# Patient Record
Sex: Female | Born: 1970 | Race: Black or African American | Hispanic: No | Marital: Married | Smoking: Never smoker
Health system: Southern US, Community
[De-identification: ages and names within clinical notes are randomized; demographics above are authoritative.]

---

## 2015-10-03 ENCOUNTER — Encounter (HOSPITAL_COMMUNITY): Payer: Self-pay | Admitting: Emergency Medicine

## 2015-10-03 ENCOUNTER — Emergency Department (INDEPENDENT_AMBULATORY_CARE_PROVIDER_SITE_OTHER)
Admission: EM | Admit: 2015-10-03 | Discharge: 2015-10-03 | Disposition: A | Discharge status: Home / Self Care | Payer: Medicaid Other | Source: Home / Self Care | Attending: Emergency Medicine | Admitting: Emergency Medicine

## 2015-10-03 DIAGNOSIS — K089 Disorder of teeth and supporting structures, unspecified: Secondary | ICD-10-CM

## 2015-10-03 DIAGNOSIS — G43809 Other migraine, not intractable, without status migrainosus: Secondary | ICD-10-CM | POA: Diagnosis not present

## 2015-10-03 MED ORDER — KETOROLAC TROMETHAMINE 60 MG/2ML IM SOLN
60.0000 mg | Freq: Once | INTRAMUSCULAR | Status: AC
  Administered 2015-10-03: 60 mg via INTRAMUSCULAR

## 2015-10-03 MED ORDER — METOCLOPRAMIDE HCL 5 MG/ML IJ SOLN
10.0000 mg | Freq: Once | INTRAMUSCULAR | Status: AC
  Administered 2015-10-03: 10 mg via INTRAMUSCULAR

## 2015-10-03 MED ORDER — KETOROLAC TROMETHAMINE 60 MG/2ML IM SOLN
INTRAMUSCULAR | Status: AC
  Filled 2015-10-03: qty 2

## 2015-10-03 MED ORDER — AMOXICILLIN 500 MG PO CAPS: 500.0000 mg | ORAL_CAPSULE | Freq: Two times a day (BID) | ORAL | Status: DC

## 2015-10-03 MED ORDER — METOCLOPRAMIDE HCL 5 MG/ML IJ SOLN
INTRAMUSCULAR | Status: AC
  Filled 2015-10-03: qty 2

## 2015-10-03 MED ORDER — IBUPROFEN 800 MG PO TABS: 800.0000 mg | ORAL_TABLET | Freq: Three times a day (TID) | ORAL | Status: DC | PRN

## 2015-10-03 MED ORDER — DEXAMETHASONE SODIUM PHOSPHATE 10 MG/ML IJ SOLN
10.0000 mg | Freq: Once | INTRAMUSCULAR | Status: AC
  Administered 2015-10-03: 10 mg via INTRAMUSCULAR

## 2015-10-03 MED ORDER — DEXAMETHASONE SODIUM PHOSPHATE 10 MG/ML IJ SOLN
INTRAMUSCULAR | Status: AC
  Filled 2015-10-03: qty 1

## 2015-10-03 NOTE — ED Provider Notes (Signed)
CSN: [id number]     Arrival date & time 10/03/15  1322 History   First MD Initiated Contact with Patient 10/03/15 1523     Chief Complaint  Patient presents with  . Dental Pain  . Headache   (Consider location/radiation/quality/duration/timing/severity/associated sxs/prior Treatment) HPI She is a 44 year old woman here for evaluation of dental pain and headache. Both of these are ongoing problems over years.  She states she has had intermittent pain in her teeth for at least 2 years. It comes and goes. She states 2 or 3 days out of the week she will have pain. She has had several teeth fall out in the last few weeks. She thinks the other ones have cavities. She has never seen a dentist. She arrived to the [place] about 2 months ago.  She also reports intermittent headache. She does currently have a headache. The pain is typically located in the left temple. It is a pulsating ache. It is associated with photophobia. No nausea. No focal numbness, tingling, weakness. She states sometimes it will occur at the same time as her teeth, but not always. She has not tried any medicines.  History reviewed. No pertinent past medical history. History reviewed. No pertinent past surgical history. History reviewed. No pertinent family history. Social History  Substance Use Topics  . Smoking status: Never Smoker   . Smokeless tobacco: None  . Alcohol Use: No   OB History    No data available     Review of Systems As in history of present illness Allergies  Review of patient's allergies indicates no known allergies.  Home Medications   Prior to Admission medications   Medication Sig Start Date End Date Taking? Authorizing Provider  amoxicillin (AMOXIL) 500 MG capsule Take 1 capsule (500 mg total) by mouth 2 (two) times daily. 10/03/15   Charm Rings, MD  ibuprofen (ADVIL,MOTRIN) 800 MG tablet Take 1 tablet (800 mg total) by mouth every 8 (eight) hours as needed for moderate pain. 10/03/15    Charm Rings, MD   Meds Ordered and Administered this Visit   Medications  ketorolac (TORADOL) injection 60 mg (not administered)  metoCLOPramide (REGLAN) injection 10 mg (not administered)  dexamethasone (DECADRON) injection 10 mg (not administered)    BP 162/80 mmHg  Pulse 65  Temp(Src) 98.7 F (37.1 C) (Oral)  Resp 16  SpO2 99%  LMP  (LMP Unknown) No data found.   Physical Exam  Constitutional: She is oriented to person, place, and time. She appears well-developed and well-nourished. No distress.  HENT:  Mouth/Throat: Oropharynx is clear and moist. Abnormal dentition. Dental caries present.  Multiple teeth are broken. Her lower incisor is quite loose. Multiple teeth have erythema at the gumline.  Eyes: Conjunctivae and EOM are normal. Pupils are equal, round, and reactive to light.  Neck: Neck supple.  Cardiovascular: Normal rate, regular rhythm and normal heart sounds.   No murmur heard. Pulmonary/Chest: Effort normal and breath sounds normal. No respiratory distress. She has no wheezes. She has no rales.  Lymphadenopathy:    She has no cervical adenopathy.  Neurological: She is alert and oriented to person, place, and time. No cranial nerve deficit. She exhibits normal muscle tone. Coordination normal.    ED Course  Procedures (including critical care time)  Labs Review Labs Reviewed - No data to display  Imaging Review No results found.    MDM   1. Poor dentition   2. Other type of migraine  Amoxicillin and ibuprofen for her teeth. I have given her contact information for a dentist who accepts Medicaid.  We treated her for an acute migraine today with Toradol, Reglan, and Decadron. Discussed that the ibuprofen will also help with her headache. I suspect that her dental issues are contributing to her headaches.  Follow-up as needed.   Charm Rings, MD 10/03/15 [phone]

## 2015-10-03 NOTE — Discharge Instructions (Signed)
Dr. Lawrence Marseilles is a dentist who excepts your insurance. Please call her as soon as possible to set up an appointment. Take amoxicillin 1 pill twice a day for 10 days. Use ibuprofen 800 mg every 8 hours as needed for pain.  You are having migraine headaches. Some of these are being triggered by your teeth. We gave you some medicine here to help the headache go away. You can use that ibuprofen 800 mg every 8 hours as needed for headache.  Follow-up as needed.

## 2015-10-03 NOTE — ED Notes (Signed)
Pt speaks Swahili, needed an interpreter [phone].  Pt complains of dental pain for two years and headaches an undetermined amount of time that come and go.  She states three of her teeth have fallen out and she thinks she has cavities in the others.  The pain is constantly a 2/10 with it spiking to 8-9/10 and she is unable to do anything.  The headaches are usually a 5/10.

## 2015-11-16 ENCOUNTER — Emergency Department (HOSPITAL_COMMUNITY)
Admission: EM | Admit: 2015-11-16 | Discharge: 2015-11-16 | Disposition: A | Discharge status: Home / Self Care | Payer: Medicaid Other | Attending: Emergency Medicine | Admitting: Emergency Medicine

## 2015-11-16 ENCOUNTER — Encounter (HOSPITAL_COMMUNITY): Payer: Self-pay | Admitting: *Deleted

## 2015-11-16 DIAGNOSIS — H9202 Otalgia, left ear: Secondary | ICD-10-CM | POA: Diagnosis not present

## 2015-11-16 DIAGNOSIS — K0889 Other specified disorders of teeth and supporting structures: Secondary | ICD-10-CM | POA: Insufficient documentation

## 2015-11-16 DIAGNOSIS — K029 Dental caries, unspecified: Secondary | ICD-10-CM | POA: Diagnosis not present

## 2015-11-16 DIAGNOSIS — R51 Headache: Secondary | ICD-10-CM | POA: Insufficient documentation

## 2015-11-16 MED ORDER — PENICILLIN V POTASSIUM 500 MG PO TABS: 500.0000 mg | ORAL_TABLET | Freq: Four times a day (QID) | ORAL | Status: AC

## 2015-11-16 NOTE — Discharge Instructions (Signed)
Schedule a follow up appointment with a dentist from the resource guide. Take the penicillin 4 times a day for 1 week.   Emergency Department Resource Guide 1) Find a Doctor and Pay Out of Pocket Although you won't have to find out who is covered by your insurance plan, it is a good idea to ask around and get recommendations. You will then need to call the office and see if the doctor you have chosen will accept you as a new patient and what types of options they offer for patients who are self-pay. Some doctors offer discounts or will set up payment plans for their patients who do not have insurance, but you will need to ask so you aren't surprised when you get to your appointment.  2) Contact Your Local Health Department Not all health departments have doctors that can see patients for sick visits, but many do, so it is worth a call to see if yours does. If you don't know where your local health department is, you can check in your phone book. The CDC also has a tool to help you locate your state's health department, and many state websites also have listings of all of their local health departments.  3) Find a Walk-in Clinic If your illness is not likely to be very severe or complicated, you may want to try a walk in clinic. These are popping up all over the country in pharmacies, drugstores, and shopping centers. They're usually staffed by nurse practitioners or physician assistants that have been trained to treat common illnesses and complaints. They're usually fairly quick and inexpensive. However, if you have serious medical issues or chronic medical problems, these are probably not your best option.  No Primary Care Doctor: - Call Health Connect at  [phone] - they can help you locate a primary care doctor that  accepts your insurance, provides certain services, etc. - Physician Referral Service- [phone]  Chronic Pain Problems: Organization         Address  Phone   Notes  [facility]  [phone] Patients need to be referred by their primary care doctor.   Medication Assistance: Organization         Address  Phone   Notes  [facility] [address] [place], [place] [address] [phone] --Must be a resident of [facility] -- Must have NO insurance coverage whatsoever (no Medicaid/ Medicare, etc.) -- The pt. MUST have a primary care doctor that directs their care regularly and follows them in the community   MedAssist  [phone]   [employer]  [phone]    Agencies that provide inexpensive medical care: Organization         Address  Phone   Notes  [facility]  [phone]   [facility]    [phone]   [facility] [address] [place], [place] [address] [phone]   [facility] [address], [place] [phone]   [facility]anned Parenthood    [phone]   [facility]    [phone]   [facility]  [address], [place] Phone:  [phone], Fax:  [phone] Hours of Operation:  9 am - 6 pm, M-F.  Also accepts Medicaid/Medicare and self-pay.  [facility]  [address], [place] Phone: [phone](336) [phone],  Fax: [phone]. Hours of Operation:  8:30 am - 5:30 pm, M-F.  Also accepts Medicaid and self-pay.  [facility] [address] [place] Phone: [phone]   [place], [place], [place] [phone], Ext. [phone] Mondays & Thursdays: 7-9 AM.  First 15 patients are seen on a first come, first serve basis.    [place] Providers:  Organization         Address  Phone   Notes  [facility] [address], [place] [phone] Also accepts self-pay patients.  [facility] [id number] [place], [place]  [phone]   [place], [address], [place] [phone]   [facility] [address], [place] [phone]   Lucianne Lei [address], [place]   [phone] Only accepts [place] Access [place] patients after they have their name applied to their card.   Self-Pay (no insurance) in [facility]:  Organization         Address  Phone   Notes  Sickle Cell Patients, [facility] [place] [phone]   [facility] [place] [phone]   [facility] [place]  [place], [place], [place] [phone]   [facility]/Dr. Osei-Bonsu  [address] [place] or [address], [place] [phone] Phone number for both [place] and [place] locations is the same.  Urgent Medical and [facility] [address] [place] [phone]   [facility] [address] [place] or [address] [phone]   [facility] [address], [place] [phone], phone; [phone], fax Sees patients 1st and 3rd Saturday of every month.  Must not qualify for public or private insurance (i.e. Medicaid, Medicare, [place] Health Choice, Veterans' Benefits)  Household income should be no more than 200% of the poverty level The clinic cannot treat you if you are pregnant or think you are pregnant  Sexually transmitted diseases are not treated at the clinic.    Dental Care: Organization         Address  Phone  Notes  [facility] Department of [facility] [place] [phone] Accepts children up to age 15 who are enrolled in [place] or [place]; pregnant women with a Medicaid card; and children who have applied for  Medicaid or [place] Health Choice, but were declined, whose parents can pay a reduced fee at time of service.  [facility] Department of [facility]  [address], [place] [phone] Accepts children up to age 98 who are enrolled in [place] or [place]; pregnant women with a Medicaid card; and children who have applied for Medicaid or [place] Health Choice, but were declined, whose parents can pay a reduced fee at time of service.  [facility]  [place] [phone] Patients are seen by appointment only. Walk-ins are not accepted. [place] will see patients 46 years of age and older. Monday - Tuesday (8am-5pm) Most Wednesdays (8:30-5pm) $30 per visit, cash only  [facility] [employer] PRO[facility]  [address], [employer] (  [phone] Patients are seen by appointment only. Walk-ins are not accepted. [place] will see patients 46 years of age and older. One Wednesday Evening (Monthly: Volunteer Based).  $30 per visit, cash only  [place]  [phone] for adults; Children under age 55, call Graduate Pediatric Dentistry at [phone]. Children aged 45-14, please call [phone] to request a pediatric application.  Dental services are provided in all areas of dental care including fillings, crowns and bridges, complete and partial dentures, implants, gum treatment, root canals, and extractions. Preventive care is also provided. Treatment is provided to both adults and children. Patients are selected via a lottery and there is often a waiting list.   [facility] [address] [place]  [phone] [email or web address]   [facility] [address] [place], [place] [phone], Ext. [phone] Second and Fourth Thursday of each month, opens at 6:30 AM; Clinic ends at 9 AM.  Patients are seen on a first-come first-served basis, and a limited number  are seen during each clinic.   [facility]  [address] Hillard Danker [place], [place] [phone]   Eligibility Requirements You must have lived in [place], [place], or [place] counties for at least the last three months.   You cannot be eligible for state or federal sponsored [employer], including [employer], [place], or [employer].   You generally cannot be eligible for healthcare insurance through your employer.    How to apply: Eligibility screenings are held every Tuesday and Wednesday afternoon from 1:00 pm until 4:00 pm. You do not need an appointment for the interview!  [facility] [address], [place], [place]   [place]  [place] Department  [place]  [phone]    Behavioral Health Resources in the Community: Intensive Outpatient Programs Organization         Address  Phone  Notes  [place]. [address], [place], [place] [phone]   [facility] Outpatient [address] [place], [place]   ADS: Alcohol & Drug Svcs [address], [place], [place]   [place] [address],  [place], [place] or [phone]   Substance Abuse Resources Organization         Address  Phone  Notes  Alcohol and Drug Services  [phone]   [place]  [phone]   [facility] [place]   Chinita Pester  [phone]   Residential & Outpatient Substance Abuse Program  [phone]   Psychological Services Organization         Address  Phone  Notes  [facility] [place]  [place]  [phone]   [place] [address] [place] or [phone]    Mobile Crisis Teams Organization         Address  Phone  Notes  Therapeutic  Alternatives, Mobile Crisis Care Unit  [phone]   Assertive Psychotherapeutic Services  [address] [place], [place]   [place] Levels [address], [place] [phone]    Self-Help/Support Groups Organization         Address  Phone             Notes  [place] of [place] - variety of support groups  336- H3156881 Call for more information  Narcotics Anonymous (NA), Caring Services [address], [employer] [place]  2 meetings at this location   Residential Facilities manager         Address  Phone  Notes  [facility] [place],    [place]  [phone]-(684)[phone]   [facility]  [address], [place] [phone], [place], [place]   [place], [place] [phone] Admissions: 8am-3pm M-F  Incentives Substance [place] [address]    [place], [place] [phone]   [facility] [address] [place], [place], [place]   [facility] [address],  [place], [place]   [facility] Programs - Intensive Outpatient [address] Kristeen [address], [place], [place]   [facility] [place].,  [place], [place] [phone] or [phone]   [facility]idential Treatment Services (RTS) [address] [place], [place] Accepts Medicaid  [facility] [place] [address],  [place] [phone] Substance Abuse/Addiction Treatment   [facility] Organization         Address  Phone  Notes  [facility]  [phone]   Domenic Schwab, PhD [address] Arlis Porta [place], [place]   [phone] or [phone]   [place], [place] [phone]   [facility] [address] [place], [place] [phone] Insurance/Medicaid/sponsorship through [facility]  [address] [place]                                    [place], [place] [phone] [place] [address], [place] [phone]    Dr. Adele Schilder  [phone]   [facility] Dept. 1) [address] [place] 2) [place] 3)  [address], [place] [phone]  [phone]   [place] [phone] or [phone] (After Hours)

## 2015-11-16 NOTE — ED Provider Notes (Signed)
CSN: [id number]     Arrival date & time 11/16/15  8295 History  By signing my name below, I, Emmanuella Mensah, attest that this documentation has been prepared under the direction and in the presence of Kyri Dai, PA-C. Electronically Signed: Angelene Giovanni, ED Scribe. 11/16/2015. 10:58 AM.      Chief Complaint  Patient presents with  . Dental Pain  . Otalgia   The history is provided by the patient. A language interpreter was used.   HPI Comments: Madison Hunt is a 44 y.o. female who presents to the Emergency Department complaining of gradually worsening constant upper left dental pain that radiates to her left ear onset 3 days ago. Pt reports associated intermittent generalized HA when the pain becomes more severe. She explains that the pain is worse with cold foods and cold air. She denies any fever, chills, trouble swallowing, difficulty handling secretions, SOB, drainage from the ear or loss of hearing. She states that she took Tylenol for her symptoms PTA with moderate relief. States that she has a Education officer, community but has not visited him for this problem yet.  History reviewed. No pertinent past medical history. History reviewed. No pertinent past surgical history. History reviewed. No pertinent family history. Social History  Substance Use Topics  . Smoking status: Unknown If Ever Smoked  . Smokeless tobacco: None  . Alcohol Use: None   OB History    No data available     Review of Systems  Constitutional: Negative for fever and chills.  HENT: Positive for dental problem and ear pain. Negative for drooling, ear discharge, hearing loss and trouble swallowing.   Respiratory: Negative for shortness of breath.   Gastrointestinal: Negative for nausea and vomiting.  Musculoskeletal: Negative for neck pain.  Skin: Negative for color change.  Neurological: Positive for headaches.  All other systems reviewed and are negative.     Allergies  Review of patient's allergies  indicates no known allergies.  Home Medications   Prior to Admission medications   Medication Sig Start Date End Date Taking? Authorizing Provider  acetaminophen (TYLENOL) 325 MG tablet Take 650 mg by mouth every 6 (six) hours as needed.   Yes Historical Provider, MD  penicillin v potassium (VEETID) 500 MG tablet Take 1 tablet (500 mg total) by mouth 4 (four) times daily. 11/16/15 11/23/15  Kiarah Eckstein, PA-C   BP 146/95 mmHg  Pulse 63  Temp(Src) 97.8 F (36.6 C) (Oral)  Resp 20  Wt 115 lb (52.164 kg)  SpO2 98% Physical Exam  Constitutional: She appears well-developed and well-nourished. No distress.  HENT:  Head: Normocephalic and atraumatic.  Right Ear: External ear normal.  Left Ear: External ear normal.  Mouth/Throat: Oropharynx is clear and moist and mucous membranes are normal. No trismus in the jaw. Dental caries present. No dental abscesses or uvula swelling. No oropharyngeal exudate.    Poor dentition throughout. Missing second molar in left upper jaw. 3rd molar with severe decay and TTP.  No obvious abscess. No swelling or erythema of the cheeks.   Eyes: Conjunctivae are normal. Right eye exhibits no discharge. Left eye exhibits no discharge. No scleral icterus.  Neck: Normal range of motion. Neck supple.  No cervical adenopathy. FROM of neck intact. No swelling of the soft tissue.  Cardiovascular: Normal rate.   Pulmonary/Chest: Effort normal and breath sounds normal. No respiratory distress.  Musculoskeletal: Normal range of motion.  Moves all extremities spontaneously  Lymphadenopathy:    She has no cervical adenopathy.  Neurological: She is alert. Coordination normal.  Skin: Skin is warm and dry.  Psychiatric: She has a normal mood and affect. Her behavior is normal.  Nursing note and vitals reviewed.   ED Course  Procedures (including critical care time) DIAGNOSTIC STUDIES: Oxygen Saturation is 98% on RA, normal by my interpretation.    COORDINATION OF  CARE: 10:48 AM- Pt advised of plan for treatment and pt agrees. Explained that pt has a cavity on upper tooth. Will receive Penicillin and advised to follow up with dentist and to continue the OTC pain medications. Will provide a list of local dentist for establishment of care.    Labs Review Labs Reviewed - No data to display  Imaging Review No results found.   Rolm GalaStevi Traevion Poehler, PA-C has personally reviewed and evaluated these images and lab results as part of her medical decision-making.   EKG Interpretation None      MDM   Final diagnoses:  Pain, dental   Patient presenting with tooth pain. No obvious abscess on exam. No soft tissue swelling of the cheek or neck. Exam unconcerning for Ludwig's angina or spread of infection. Pt had taken tylenol PTA with good pain control. Will discharge with penicillin and encouraged pt to follow-up with dentist. Resource guide given in discharge paperwork. Return precautions discussed with pt and given in discharge paperwork. Stable for discharge.   I personally performed the services described in this documentation, which was scribed in my presence. The recorded information has been reviewed and is accurate.   Alveta HeimlichStevi Seve Monette, PA-C 11/16/15 1137  Raeford RazorStephen Kohut, MD 11/17/15 [phone]

## 2015-11-16 NOTE — ED Notes (Signed)
Pt in c/o toothache and bilateral earache for the last few days, denies fever

## 2015-11-16 NOTE — ED Notes (Signed)
Declined W/C at D/C and was escorted to lobby by RN. 

## 2015-11-16 NOTE — ED Notes (Signed)
Pt tel. Number is [phone] husbands number.  Medicaid card has [place] and wellness listed as the provider.

## 2015-11-16 NOTE — ED Notes (Signed)
Secondary assessment being completed by PA at bedside

## 2015-11-17 ENCOUNTER — Encounter (HOSPITAL_COMMUNITY): Payer: Self-pay | Admitting: Emergency Medicine

## 2016-01-26 ENCOUNTER — Ambulatory Visit: Payer: Medicaid Other | Attending: Internal Medicine | Admitting: Internal Medicine

## 2016-01-26 ENCOUNTER — Encounter: Payer: Self-pay | Admitting: Internal Medicine

## 2016-01-26 VITALS — BP 148/86 | HR 56 | Temp 98.2°F | Resp 17 | Ht 64.0 in | Wt 124.4 lb

## 2016-01-26 DIAGNOSIS — B9789 Other viral agents as the cause of diseases classified elsewhere: Secondary | ICD-10-CM

## 2016-01-26 DIAGNOSIS — J069 Acute upper respiratory infection, unspecified: Secondary | ICD-10-CM | POA: Diagnosis not present

## 2016-01-26 DIAGNOSIS — J029 Acute pharyngitis, unspecified: Secondary | ICD-10-CM | POA: Insufficient documentation

## 2016-01-26 DIAGNOSIS — M25511 Pain in right shoulder: Secondary | ICD-10-CM | POA: Diagnosis not present

## 2016-01-26 DIAGNOSIS — Z79899 Other long term (current) drug therapy: Secondary | ICD-10-CM | POA: Diagnosis not present

## 2016-01-26 DIAGNOSIS — K047 Periapical abscess without sinus: Secondary | ICD-10-CM

## 2016-01-26 MED ORDER — NAPROXEN 500 MG PO TABS
500.0000 mg | ORAL_TABLET | Freq: Two times a day (BID) | ORAL | Status: DC
Start: 2016-01-26 — End: 2023-09-23

## 2016-01-26 MED ORDER — FLUTICASONE PROPIONATE 50 MCG/ACT NA SUSP: 2.0000 | Freq: Every day | NASAL | Status: DC

## 2016-01-26 MED ORDER — AMOXICILLIN 500 MG PO CAPS: 500.0000 mg | ORAL_CAPSULE | Freq: Three times a day (TID) | ORAL | Status: DC

## 2016-01-26 MED FILL — AMOXICILLIN 500 MG CAPSULE: 500 | 10 days supply | Qty: 30 | Fill #0

## 2016-01-26 MED FILL — FLUTICASONE PROP 50 MCG SPR: 50 | 30 days supply | Qty: 16 | Fill #0

## 2016-01-26 MED FILL — NAPROXEN 500 MG TABLET: 500 | 15 days supply | Qty: 30 | Fill #0

## 2016-01-26 NOTE — Progress Notes (Signed)
Patient ID: Madison Hunt, female   DOB: 1971/08/28, 45 y.o.   MRN: [id number]   [id number]  [id number]  DOB - May 23, 1971  CC:  Chief Complaint  Patient presents with  . New Patient (Initial Visit)       HPI: Madison Hunt is a 45 y.o. female here today to establish medical care. Patient has no past medical history and is not on any medications. Been having body aches, cough, clear mucous production, sore throat, rhinitis, chills for the past week. She is concerned about dental pain in the lower back of her mouth. She was told by a dentist 3 months ago that the tooth would need removal soon. Some gum swelling.  Right shoulder pain that is constant and achy. Aggravated more by movement. No injury. She has not tried anything for pain.   No Known Allergies History reviewed. No pertinent past medical history. Current Outpatient Prescriptions on File Prior to Visit  Medication Sig Dispense Refill  . acetaminophen (TYLENOL) 325 MG tablet Take 650 mg by mouth every 6 (six) hours as needed. Reported on 01/26/2016    . amoxicillin (AMOXIL) 500 MG capsule Take 1 capsule (500 mg total) by mouth 2 (two) times daily. (Patient not taking: Reported on 01/26/2016) 20 capsule 0  . ibuprofen (ADVIL,MOTRIN) 800 MG tablet Take 1 tablet (800 mg total) by mouth every 8 (eight) hours as needed for moderate pain. (Patient not taking: Reported on 01/26/2016) 30 tablet 0   No current facility-administered medications on file prior to visit.   History reviewed. No pertinent family history. Social History   Social History  . Marital Status: Married    Spouse Name: N/A  . Number of Children: N/A  . Years of Education: N/A   Occupational History  . Not on file.   Social History Main Topics  . Smoking status: Unknown If Ever Smoked  . Smokeless tobacco: Not on file  . Alcohol Use: No  . Drug Use: No  . Sexual Activity: Not on file   Other Topics Concern  . Not on file   Social History Narrative   ** Merged History Encounter **        Review of Systems: Other than what is stated in HPI, all other systems are negative.    Objective:   Filed Vitals:   01/26/16 0938  BP: 148/86  Pulse: 56  Temp: 98.2 F (36.8 C)  Resp: 17    Physical Exam  Constitutional: She is oriented to person, place, and time.  HENT:  Right Ear: External ear normal.  Left Ear: External ear normal.  Mouth/Throat: Oropharynx is clear and moist. Dental caries (gum swelling) present.  Eyes: Right eye exhibits no discharge. Left eye exhibits no discharge.  Cardiovascular: Normal rate, regular rhythm and normal heart sounds.   Pulmonary/Chest: Effort normal and breath sounds normal.  Musculoskeletal: Normal range of motion. She exhibits no tenderness.  Lymphadenopathy:    She has cervical adenopathy.  Neurological: She is alert and oriented to person, place, and time.     No results found for: WBC, HGB, HCT, MCV, PLT No results found for: CREATININE, BUN, NA, K, CL, CO2  No results found for: HGBA1C Lipid Panel  No results found for: CHOL, TRIG, HDL, CHOLHDL, VLDL, LDLCALC     Assessment and plan:   Madison Hunt was seen today for new patient (initial visit).  Diagnoses and all orders for this visit:  Dental infection -     Begin amoxicillin (AMOXIL) 500  MG capsule; Take 1 capsule (500 mg total) by mouth 3 (three) times daily. Dental infection Patient will call to make a repeat dentist appointment after completion of antibiotics  Right shoulder pain -     Begin naproxen (NAPROSYN) 500 MG tablet; Take 1 tablet (500 mg total) by mouth 2 (two) times daily with a meal. For pain  Viral URI with cough -     fluticasone (FLONASE) 50 MCG/ACT nasal spray; Place 2 sprays into both nostrils daily.  Due to language barrier, an interpreter was present during the history-taking and subsequent discussion (and for part of the physical exam) with this patient.  Return if symptoms worsen or fail to  improve.   Madison Finland, NP-C [facility] [phone] 01/26/2016, 10:01 AM

## 2016-01-26 NOTE — Progress Notes (Signed)
swahilli interpreter used ID# [id number] Patient here to establish care Complains of cough and chills for the past couple of days Currently not taking any prescribed medications

## 2016-11-22 ENCOUNTER — Encounter (HOSPITAL_COMMUNITY): Payer: Self-pay

## 2016-11-22 ENCOUNTER — Emergency Department (HOSPITAL_COMMUNITY)
Admission: EM | Admit: 2016-11-22 | Discharge: 2016-11-23 | Disposition: A | Discharge status: Home / Self Care | Payer: BLUE CROSS/BLUE SHIELD | Attending: Emergency Medicine | Admitting: Emergency Medicine

## 2016-11-22 ENCOUNTER — Emergency Department (HOSPITAL_COMMUNITY): Payer: BLUE CROSS/BLUE SHIELD

## 2016-11-22 DIAGNOSIS — M25511 Pain in right shoulder: Secondary | ICD-10-CM | POA: Diagnosis not present

## 2016-11-22 DIAGNOSIS — R0981 Nasal congestion: Secondary | ICD-10-CM | POA: Diagnosis present

## 2016-11-22 DIAGNOSIS — M7918 Myalgia, other site: Secondary | ICD-10-CM

## 2016-11-22 DIAGNOSIS — J069 Acute upper respiratory infection, unspecified: Secondary | ICD-10-CM | POA: Diagnosis not present

## 2016-11-22 NOTE — ED Notes (Signed)
Patient transported to X-ray 

## 2016-11-22 NOTE — ED Provider Notes (Signed)
[facility]-EMERGENCY DEPT Provider Note   CSN: [id number] Arrival date & time: 11/22/16  1623  History   Chief Complaint Chief Complaint  Patient presents with  . URI    HPI Madison Hunt is a 45 y.o. female.  HPI   HPI done with Swahili interpretor, there was still some difficulty communicating due to patients dialect not being available.   Patient has been having right shoulder/arm pain for 1 month. 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 She comes to the ER today because she is having nasal congestion and a headache. She has not had fevers, CP, SOB, weakness, fevers, dysuria, jaundice, confusion. Her headache is frontal and right parietal. She denies having any neck pain, back pain, abdominal pain.  No past medical history on file.  There are no active problems to display for this patient.   No past surgical history on file.  OB History    Gravida Para Term Preterm AB Living   0 0 0 0 0     SAB TAB Ectopic Multiple Live Births   0 0 0           Home Medications    Prior to Admission medications   Medication Sig Start Date End Date Taking? Authorizing Provider  acetaminophen (TYLENOL) 325 MG tablet Take 650 mg by mouth every 6 (six) hours as needed. Reported on 01/26/2016    Historical Provider, MD  amoxicillin (AMOXIL) 500 MG capsule Take 1 capsule (500 mg total) by mouth 2 (two) times daily. Patient not taking: Reported on 01/26/2016 10/03/15   Charm Rings, MD  amoxicillin (AMOXIL) 500 MG capsule Take 1 capsule (500 mg total) by mouth 3 (three) times daily. Dental infection 01/26/16   Ambrose Finland, NP  azithromycin (ZITHROMAX) 250 MG tablet Take 1 tablet (250 mg total) by mouth daily. Take 1 tab every day until finished. 11/24/16   Maika Mcelveen Neva Seat, PA-C  fluticasone (FLONASE) 50 MCG/ACT nasal spray Place 2 sprays into both nostrils daily. 01/26/16   Ambrose Finland, NP  guaiFENesin (ROBITUSSIN) 100 MG/5ML liquid Take 5-10 mLs (100-200 mg  total) by mouth every 4 (four) hours as needed for cough. 11/23/16   Thea Holshouser Neva Seat, PA-C  ibuprofen (ADVIL,MOTRIN) 600 MG tablet Take 1 tablet (600 mg total) by mouth every 6 (six) hours as needed. 11/23/16   Adalida Garver Neva Seat, PA-C  ibuprofen (ADVIL,MOTRIN) 800 MG tablet Take 1 tablet (800 mg total) by mouth every 8 (eight) hours as needed for moderate pain. Patient not taking: Reported on 01/26/2016 10/03/15   Charm Rings, MD  naproxen (NAPROSYN) 500 MG tablet Take 1 tablet (500 mg total) by mouth 2 (two) times daily with a meal. For pain 01/26/16   Ambrose Finland, NP    Family History No family history on file.  Social History Social History  Substance Use Topics  . Smoking status: Unknown If Ever Smoked  . Smokeless tobacco: Never Used  . Alcohol use No     Allergies   Patient has no known allergies.   Review of Systems Review of Systems Review of Systems All other systems negative except as documented in the HPI. All pertinent positives and negatives as reviewed in the HPI.   Physical Exam Updated Vital Signs BP 153/69   Pulse 61   Temp 98.1 F (36.7 C) (Oral)   Resp 18   LMP  (LMP Unknown)   SpO2 100%   Physical Exam  Constitutional: She appears well-developed  and well-nourished.  HENT:  Head: Normocephalic and atraumatic.  Right Ear: Tympanic membrane and ear canal normal.  Left Ear: Tympanic membrane and ear canal normal.  Nose: Right sinus exhibits maxillary sinus tenderness and frontal sinus tenderness.  Eyes: Conjunctivae are normal. Pupils are equal, round, and reactive to light.  Neck: Trachea normal, normal range of motion and full passive range of motion without pain. Neck supple.  Cardiovascular: Normal rate, regular rhythm and normal pulses.   Pulmonary/Chest: Effort normal and breath sounds normal. Chest wall is not dull to percussion. She exhibits no tenderness, no crepitus, no edema, no deformity and no retraction.  Abdominal: Soft. Normal appearance  and bowel sounds are normal.  Musculoskeletal: Normal range of motion.       Right shoulder: She exhibits tenderness and pain. She exhibits normal range of motion, no bony tenderness, no swelling, no effusion, no crepitus, no deformity, no laceration, normal pulse and normal strength.  No LE swelling  Lymphadenopathy:       Head (right side): No submental, no submandibular, no tonsillar, no preauricular, no posterior auricular and no occipital adenopathy present.       Head (left side): No submental, no submandibular, no tonsillar, no preauricular, no posterior auricular and no occipital adenopathy present.    She has no cervical adenopathy.    She has no axillary adenopathy.  Neurological: She is alert. She has normal strength.  Skin: Skin is warm, dry and intact.  Psychiatric: She has a normal mood and affect. Her speech is normal and behavior is normal. Judgment and thought content normal. Cognition and memory are normal.     ED Treatments / Results  Labs (all labs ordered are listed, but only abnormal results are displayed) Labs Reviewed - No data to display  EKG  EKG Interpretation None       Radiology Dg Chest 2 View  Result Date: 11/22/2016 CLINICAL DATA:  Upper respiratory infection symptoms for 1 month. EXAM: CHEST  2 VIEW COMPARISON:  None. FINDINGS: The heart size and mediastinal contours are within normal limits. Both lungs are clear. The visualized skeletal structures are unremarkable. IMPRESSION: No active cardiopulmonary disease. Electronically Signed   By: Burman NievesWilliam  Stevens M.D.   On: 11/22/2016 23:32    Procedures Procedures (including critical care time)  Medications Ordered in ED Medications  azithromycin (ZITHROMAX) tablet 500 mg (500 mg Oral Given 11/23/16 0117)  ibuprofen (ADVIL,MOTRIN) tablet 800 mg (800 mg Oral Given 11/23/16 0118)  guaiFENesin (ROBITUSSIN) 100 MG/5ML solution 100 mg (100 mg Oral Given 11/23/16 0117)     Initial Impression /  Assessment and Plan / ED Course  I have reviewed the triage vital signs and the nursing notes.  Pertinent labs & imaging results that were available during my care of the patient were reviewed by me and considered in my medical decision making (see chart for details).  Clinical Course     Pt is well appearing. Does not have any systemic symptoms or concern for malaria at this time. She has sinus pressure and MSK pain. Pt symptoms consistent with URI. CXR negative for acute infiltrate. Pt will be discharged with symptomatic treatment as well as Azithromycin.  Discussed return precautions.  Pt is hemodynamically stable & in NAD prior to discharge.   Final Clinical Impressions(s) / ED Diagnoses   Final diagnoses:  Musculoskeletal pain  Upper respiratory tract infection, unspecified type    New Prescriptions New Prescriptions   AZITHROMYCIN (ZITHROMAX) 250 MG TABLET  Take 1 tablet (250 mg total) by mouth daily. Take 1 tab every day until finished.   GUAIFENESIN (ROBITUSSIN) 100 MG/5ML LIQUID    Take 5-10 mLs (100-200 mg total) by mouth every 4 (four) hours as needed for cough.   IBUPROFEN (ADVIL,MOTRIN) 600 MG TABLET    Take 1 tablet (600 mg total) by mouth every 6 (six) hours as needed.     Marlon Peliffany Nabria Nevin, PA-C 11/23/16 0202    Gilda Creasehristopher J Pollina, MD 11/23/16 [phone]

## 2016-11-22 NOTE — ED Triage Notes (Addendum)
Triage completed using the pacific interpreter line. Pt unable to answer many of the questions even with using the interpreter line. Pt states she has a headache and a cold. Pt states she has been feeling bad X1 month.

## 2016-11-23 MED ORDER — AZITHROMYCIN 250 MG PO TABS: 250.0000 mg | ORAL_TABLET | Freq: Every day | ORAL | 0 refills | Status: DC

## 2016-11-23 MED ORDER — IBUPROFEN 600 MG PO TABS: 600.0000 mg | ORAL_TABLET | Freq: Four times a day (QID) | ORAL | 0 refills | Status: DC | PRN

## 2016-11-23 MED ORDER — IBUPROFEN 800 MG PO TABS
800.0000 mg | ORAL_TABLET | Freq: Once | ORAL | Status: AC
  Administered 2016-11-23: 800 mg via ORAL
  Filled 2016-11-23: qty 1

## 2016-11-23 MED ORDER — GUAIFENESIN 100 MG/5ML PO LIQD: 100.0000 mg | ORAL | 0 refills | Status: DC | PRN

## 2016-11-23 MED ORDER — GUAIFENESIN 100 MG/5ML PO SOLN
5.0000 mL | Freq: Once | ORAL | Status: AC
  Administered 2016-11-23: 100 mg via ORAL
  Filled 2016-11-23: qty 5

## 2016-11-23 MED ORDER — AZITHROMYCIN 250 MG PO TABS
500.0000 mg | ORAL_TABLET | Freq: Once | ORAL | Status: AC
  Administered 2016-11-23: 500 mg via ORAL
  Filled 2016-11-23: qty 2

## 2017-02-16 ENCOUNTER — Emergency Department (HOSPITAL_COMMUNITY): Payer: BLUE CROSS/BLUE SHIELD

## 2017-02-16 ENCOUNTER — Encounter (HOSPITAL_COMMUNITY): Payer: Self-pay | Admitting: Emergency Medicine

## 2017-02-16 ENCOUNTER — Emergency Department (HOSPITAL_COMMUNITY)
Admission: EM | Admit: 2017-02-16 | Discharge: 2017-02-16 | Disposition: A | Discharge status: Home / Self Care | Payer: BLUE CROSS/BLUE SHIELD | Attending: Emergency Medicine | Admitting: Emergency Medicine

## 2017-02-16 ENCOUNTER — Ambulatory Visit (HOSPITAL_COMMUNITY)
Admission: EM | Admit: 2017-02-16 | Discharge: 2017-02-16 | Disposition: A | Discharge status: Home / Self Care | Payer: BLUE CROSS/BLUE SHIELD

## 2017-02-16 DIAGNOSIS — K146 Glossodynia: Secondary | ICD-10-CM | POA: Insufficient documentation

## 2017-02-16 DIAGNOSIS — M25562 Pain in left knee: Secondary | ICD-10-CM | POA: Diagnosis not present

## 2017-02-16 DIAGNOSIS — R002 Palpitations: Secondary | ICD-10-CM | POA: Insufficient documentation

## 2017-02-16 DIAGNOSIS — R000 Tachycardia, unspecified: Secondary | ICD-10-CM | POA: Diagnosis present

## 2017-02-16 LAB — CBC
HEMATOCRIT: 38.3 % (ref 36.0–46.0)
HEMOGLOBIN: 12.9 g/dL (ref 12.0–15.0)
MCH: 29 pg (ref 26.0–34.0)
MCHC: 33.7 g/dL (ref 30.0–36.0)
MCV: 86.1 fL (ref 78.0–100.0)
Platelets: 218 10*3/uL (ref 150–400)
RBC: 4.45 MIL/uL (ref 3.87–5.11)
RDW: 13.9 % (ref 11.5–15.5)
WBC: 5.8 10*3/uL (ref 4.0–10.5)

## 2017-02-16 LAB — BASIC METABOLIC PANEL
ANION GAP: 6 (ref 5–15)
BUN: 7 mg/dL (ref 6–20)
CHLORIDE: 106 mmol/L (ref 101–111)
CO2: 27 mmol/L (ref 22–32)
Calcium: 9 mg/dL (ref 8.9–10.3)
Creatinine, Ser: 0.5 mg/dL (ref 0.44–1.00)
GFR calc Af Amer: >60 mL/min (ref >60)
GLUCOSE: 87 mg/dL (ref 65–99)
POTASSIUM: 3.5 mmol/L (ref 3.5–5.1)
Sodium: 139 mmol/L (ref 135–145)

## 2017-02-16 LAB — I-STAT TROPONIN, ED: Troponin i, poc: 0 ng/mL (ref 0.00–0.08)

## 2017-02-16 MED ORDER — IBUPROFEN 800 MG PO TABS: 800.0000 mg | ORAL_TABLET | Freq: Three times a day (TID) | ORAL | 0 refills | Status: DC | PRN

## 2017-02-16 NOTE — ED Triage Notes (Signed)
Translator stated, I have heart racing and my tongue doing all kinds of twitching and moving a lot. This started Thursday.

## 2017-02-16 NOTE — ED Provider Notes (Signed)
Emergency Department Provider Note   I have reviewed the triage vital signs and the nursing notes.  Encounter with [employer] translator.   HISTORY  Chief Complaint Foot Pain; Tachycardia; and Leg Pain   HPI Madison Hunt is a 46 y.o. female with no significant past medical history presents to the emergency department for evaluation of multiple medical complaints including intermittent heart palpitations, abnormal tongue movements, and left leg pain with squatting. She she's had intermittent heart palpitations over the past 3 days. Her tongue movements have been intermittent for several months and her left leg pain is a soreness that is worse with squatting down. No modifying factors for any of the above complaints. She is not taken any over-the-counter medications. She states that her primary concern is for her heart but she would also like the other complaints addressed as well. No history of heart attack or stroke. Patient denies taking any new medications or herbal supplements.   History reviewed. No pertinent past medical history.  There are no active problems to display for this patient.   History reviewed. No pertinent surgical history.  Current Outpatient Rx  . Order #: [id number] Class: Historical Med  . Order #: [id number] Class: Normal  . Order #: [id number] Class: Print  . Order #: [id number] Class: Print  . Order #: [id number] Class: Normal    Allergies Patient has no known allergies.  No family history on file.  Social History Social History  Substance Use Topics  . Smoking status: Never Smoker  . Smokeless tobacco: Never Used  . Alcohol use No    Review of Systems  Constitutional: No fever/chills Eyes: No visual changes. ENT: No sore throat. Intermittent abnormal tongue movements.  Cardiovascular: Denies chest pain. Positive palpitations.  Respiratory: Denies shortness of breath. Gastrointestinal: No abdominal pain.  No nausea, no vomiting.  No diarrhea.   No constipation. Genitourinary: Negative for dysuria. Musculoskeletal: Negative for back pain. Left knee pain with squatting.  Skin: Negative for rash. Neurological: Negative for headaches, focal weakness or numbness.  10-point ROS otherwise negative.  ____________________________________________   PHYSICAL EXAM:  VITAL SIGNS: ED Triage Vitals  Enc Vitals Group     BP 02/16/17 1338 (!) 155/112     Pulse Rate 02/16/17 1338 99     Resp 02/16/17 1338 18     Temp 02/16/17 1338 98.5 F (36.9 C)     Temp Source 02/16/17 1338 Oral     SpO2 02/16/17 1338 99 %     Pain Score 02/16/17 1322 5   Constitutional: Alert and oriented. Well appearing and in no acute distress. Eyes: Conjunctivae are normal. Head: Atraumatic. Nose: No congestion/rhinnorhea. Mouth/Throat: Mucous membranes are moist.  Oropharynx non-erythematous. Neck: No stridor.   Cardiovascular: Sinus rhythm. Good peripheral circulation. Grossly normal heart sounds.   Respiratory: Normal respiratory effort.  No retractions. Lungs CTAB. Gastrointestinal: Soft and nontender. No distention.  Musculoskeletal: No lower extremity tenderness nor edema. No gross deformities of extremities. No knee effusion. No overlying erythema or warmth. Normal ROM. Weight bearing without difficulty.  Neurologic:  Normal speech and language. No gross focal neurologic deficits are appreciated.  Skin:  Skin is warm, dry and intact. No rash noted. Psychiatric: Mood and affect are normal. Speech and behavior are normal.  ____________________________________________   LABS (all labs ordered are listed, but only abnormal results are displayed)  Labs Reviewed  BASIC METABOLIC PANEL  CBC  I-STAT TROPOININ, ED   ____________________________________________  EKG   EKG Interpretation  Date/Time:  Sunday February 16 2017 13:13:31 EST Ventricular Rate:  57 PR Interval:  194 QRS Duration: 84 QT Interval:  454 QTC Calculation: 441 R  Axis:   54 Text Interpretation:  Sinus bradycardia Nonspecific T wave abnormality Abnormal ECG No STEMI.  Confirmed by Jobani Sabado MD, Ambreen Tufte [phone]) on 02/16/2017 2:00:21 PM       ____________________________________________  RADIOLOGY  CXR and left Knee x-rays reviewed. No acute findings.  ____________________________________________   PROCEDURES  Procedure(s) performed:   Procedures  None ____________________________________________   INITIAL IMPRESSION / ASSESSMENT AND PLAN / ED COURSE  Pertinent labs & imaging results that were available during my care of the patient were reviewed by me and considered in my medical decision making (see chart for details).  Patient resents to the emergency department for evaluation of intermittent heart palpitations, knee pain, abnormal tongue movements. The patient is not on any medications at home that I expect to cause dystonic reactions. No abnormal tongue movements identified on my oral exam. Patient is weightbearing on her legs without difficulty. She has no appreciable effusion. No concern for septic arthritis. Plan for plain film of the knee. We'll obtain chest x-ray, labs, troponin for heart palpitations.  Normal labs, CXR, and knee x-ray. Will prescribe short course of NSAID and refer to PCP for further mgmt. [facility] in the past.  ____________________________________________  FINAL CLINICAL IMPRESSION(S) / ED DIAGNOSES  Final diagnoses:  Acute pain of left knee  Palpitations  Tongue pain     MEDICATIONS GIVEN DURING THIS VISIT:  None  NEW OUTPATIENT MEDICATIONS STARTED DURING THIS VISIT:  Motrin 800 mg   Note:  This document was prepared using Dragon voice recognition software and may include unintentional dictation errors.  Alona Bene, MD Emergency Medicine   Maia Plan, MD 02/18/17 1051

## 2017-02-16 NOTE — Discharge Instructions (Signed)

## 2017-02-16 NOTE — ED Triage Notes (Signed)
Translator stated, her legs and feet hurts for 2 months.

## 2017-10-15 IMAGING — CR DG CHEST 2V
2 series · 2 of 2 positions shown · non-contrast
Comparison: None.

CLINICAL DATA: Upper respiratory infection symptoms for 1 month.

EXAM:
CHEST  2 VIEW

[chest pa]
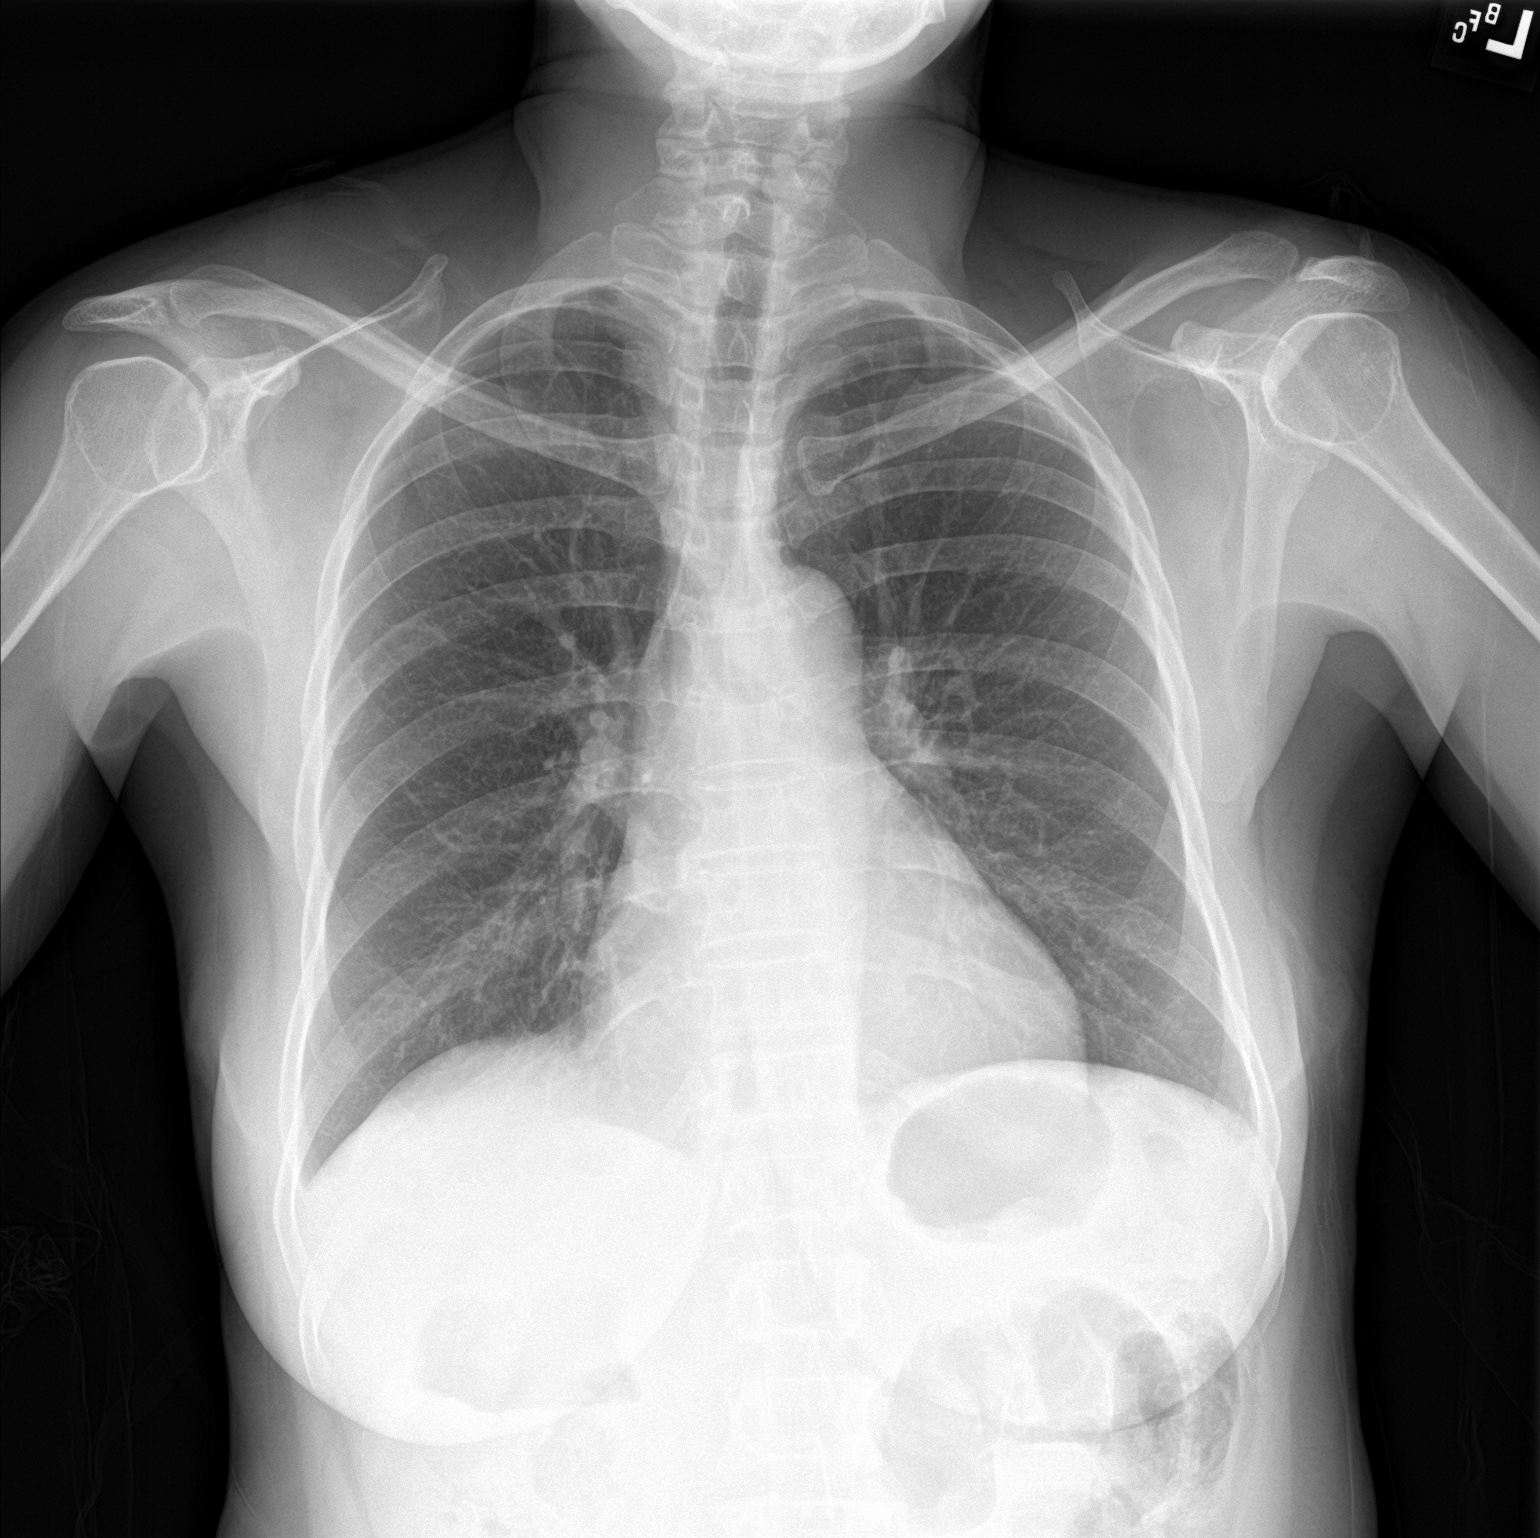

[chest lat]
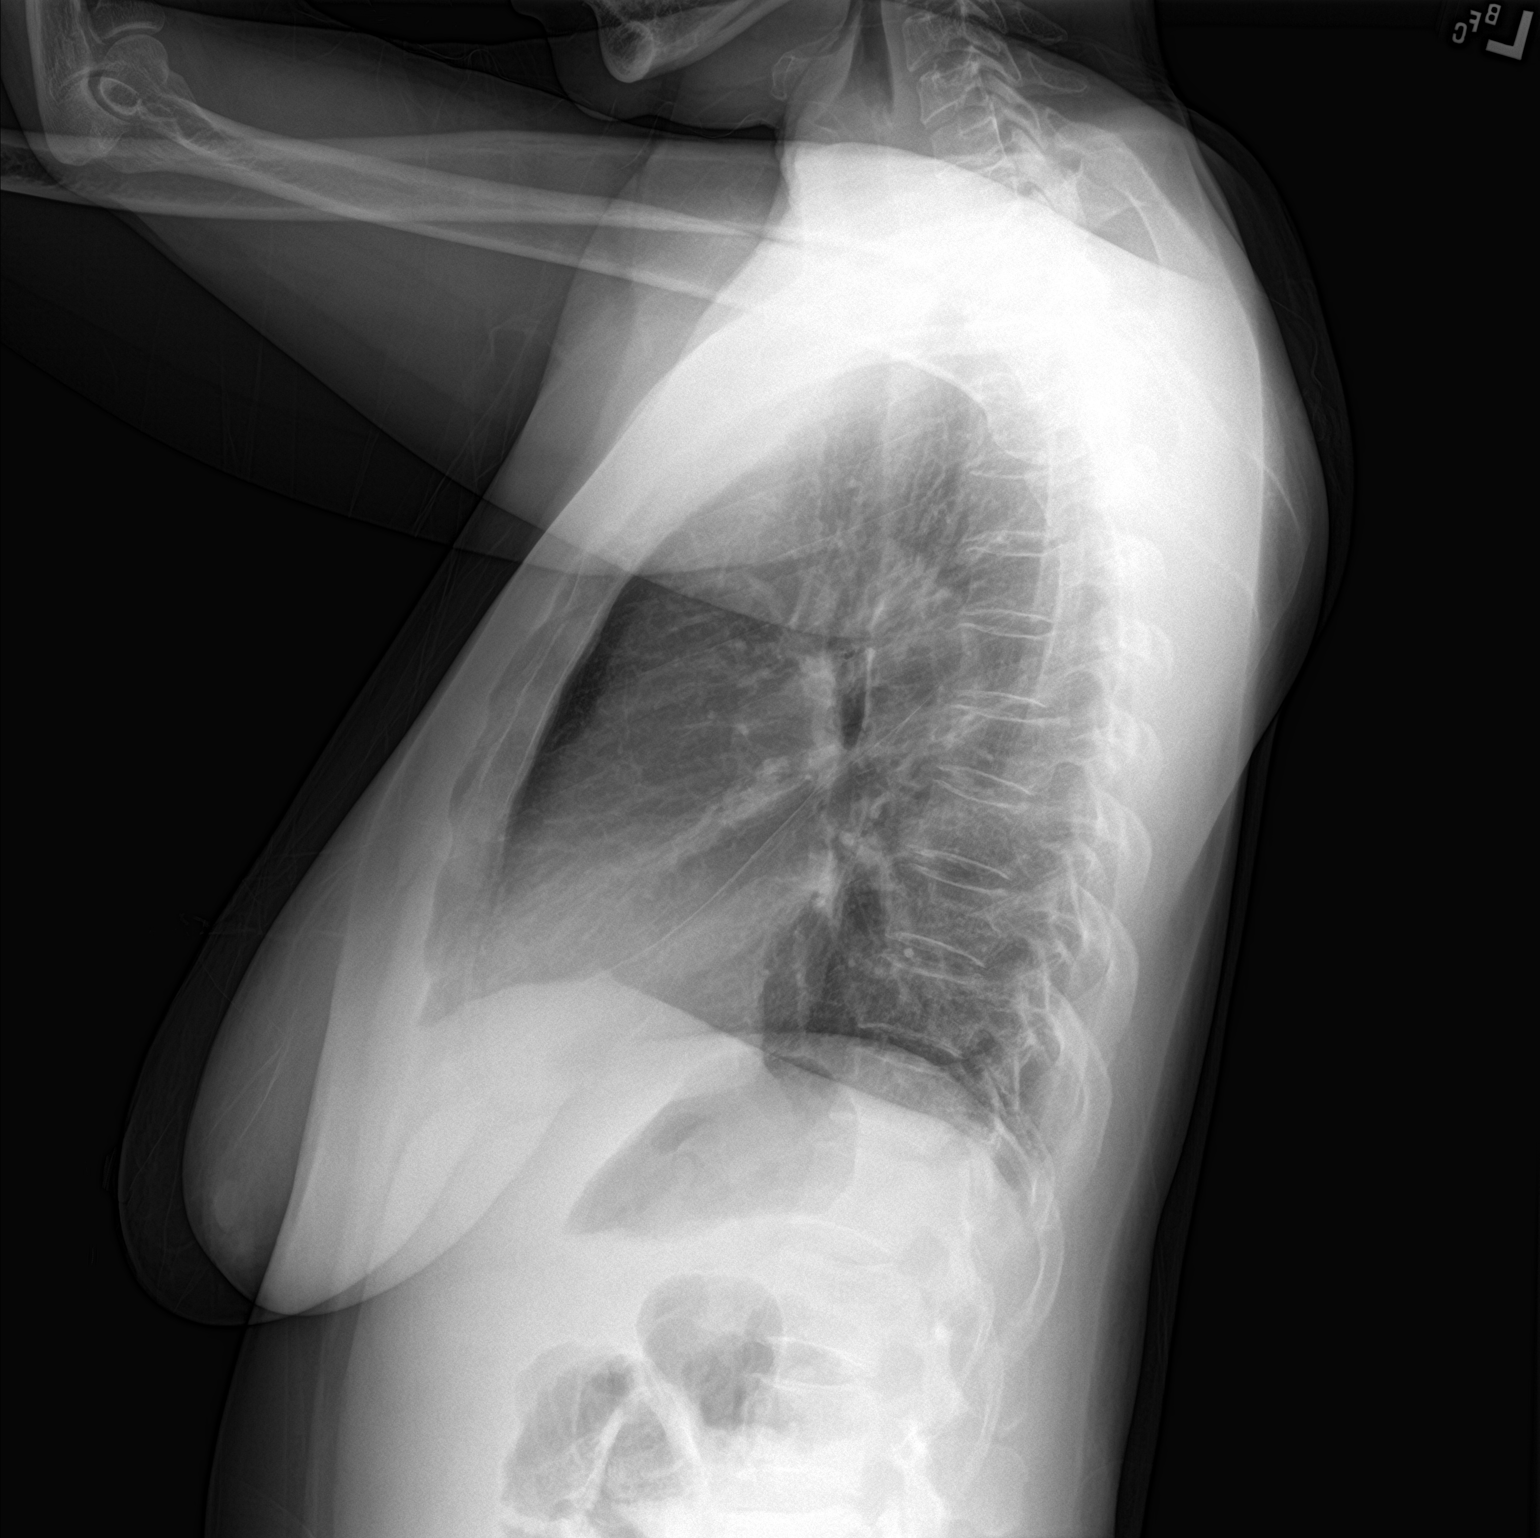

[2 of 2 positions shown; findings below may reference images not displayed]

FINDINGS: The heart size and mediastinal contours are within normal limits.
Both lungs are clear. The visualized skeletal structures are
unremarkable.
IMPRESSION: No active cardiopulmonary disease.

## 2018-01-09 IMAGING — CR DG CHEST 2V
2 series · 2 of 2 positions shown · non-contrast
Comparison: Chest radiograph 11/22/2016

CLINICAL DATA: Patient with tachycardia.  Leg and foot pain.

EXAM:
CHEST  2 VIEW

[chest lat]
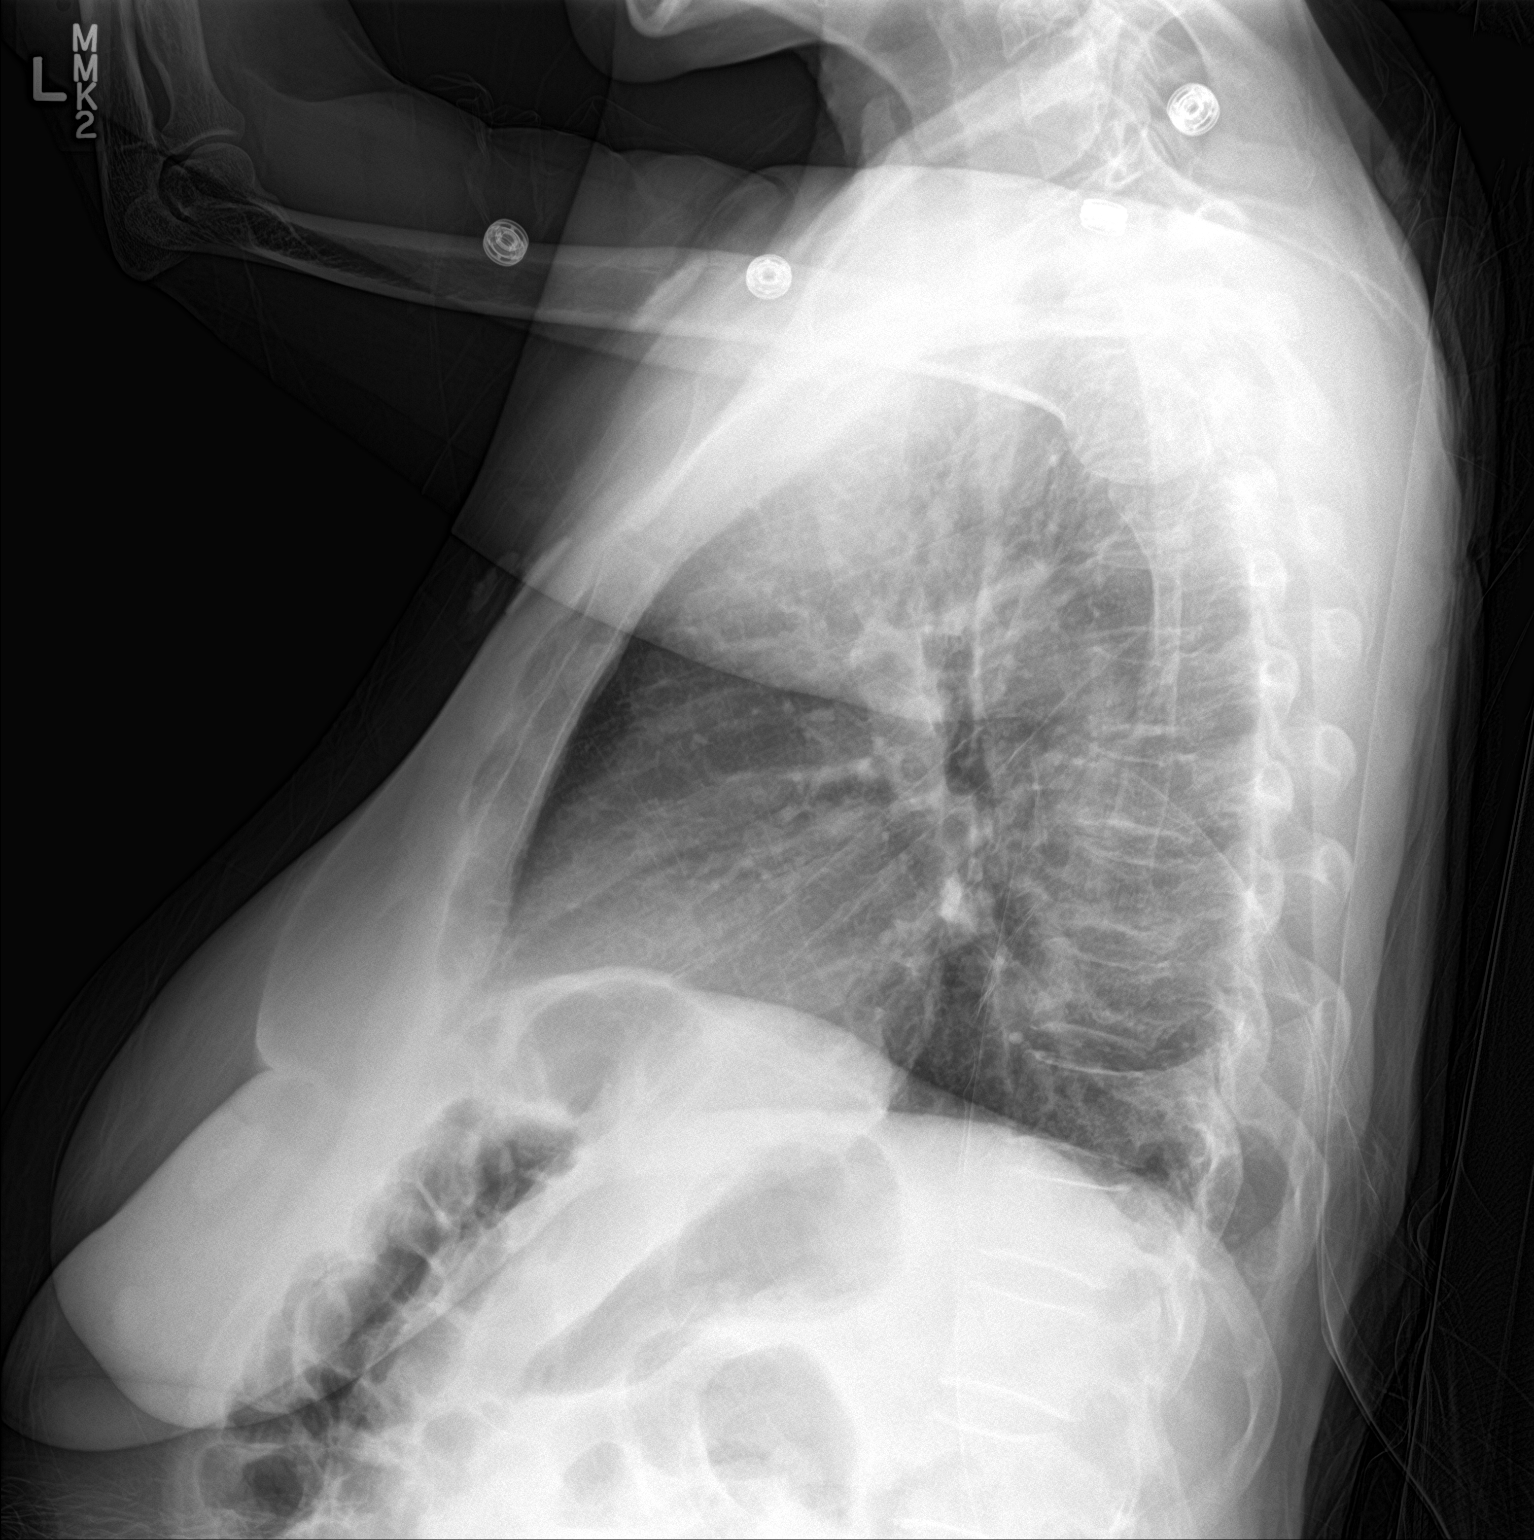

[chest ap]
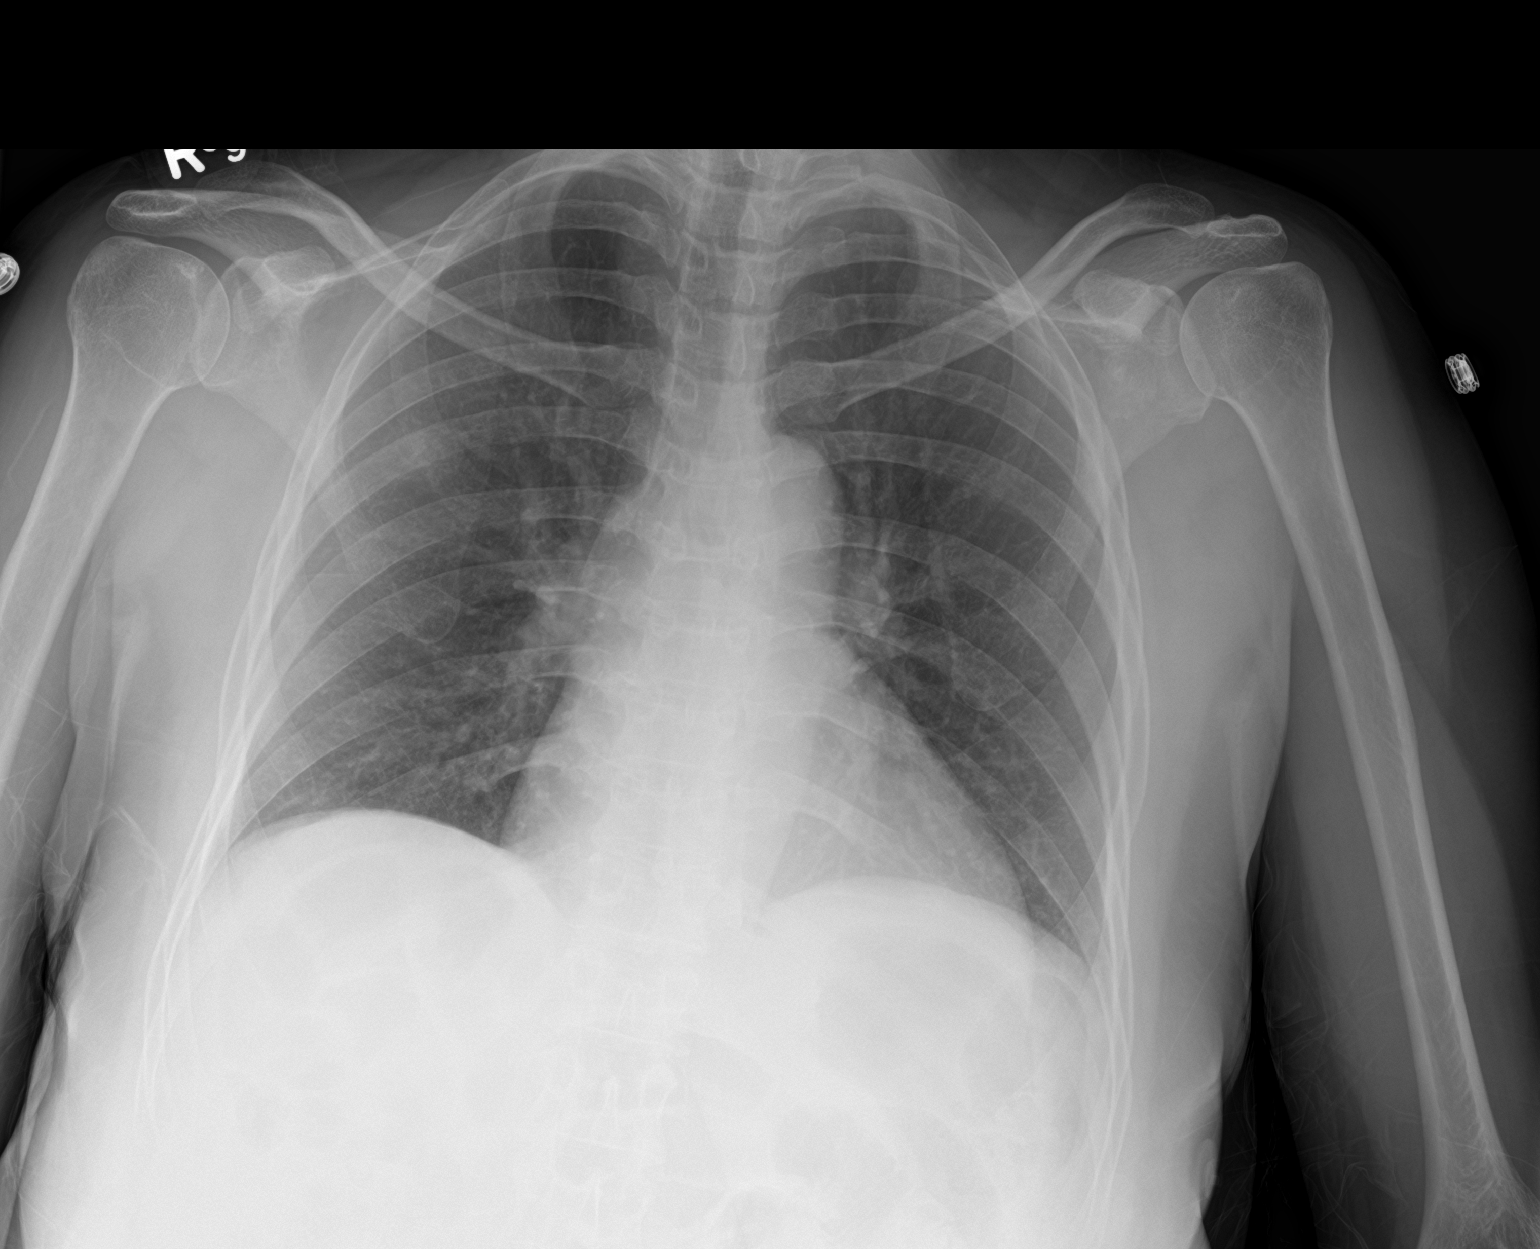

[2 of 2 positions shown; findings below may reference images not displayed]

FINDINGS: The heart size and mediastinal contours are within normal limits.
Both lungs are clear. The visualized skeletal structures are
unremarkable.
IMPRESSION: No active cardiopulmonary disease.

## 2018-01-09 IMAGING — CR DG KNEE 1-2V*L*
2 series · 2 of 2 positions shown · non-contrast
Comparison: No priors.

CLINICAL DATA: 46-year-old female with history of left knee pain.

EXAM:
LEFT KNEE - 1-2 VIEW

[knee ap]
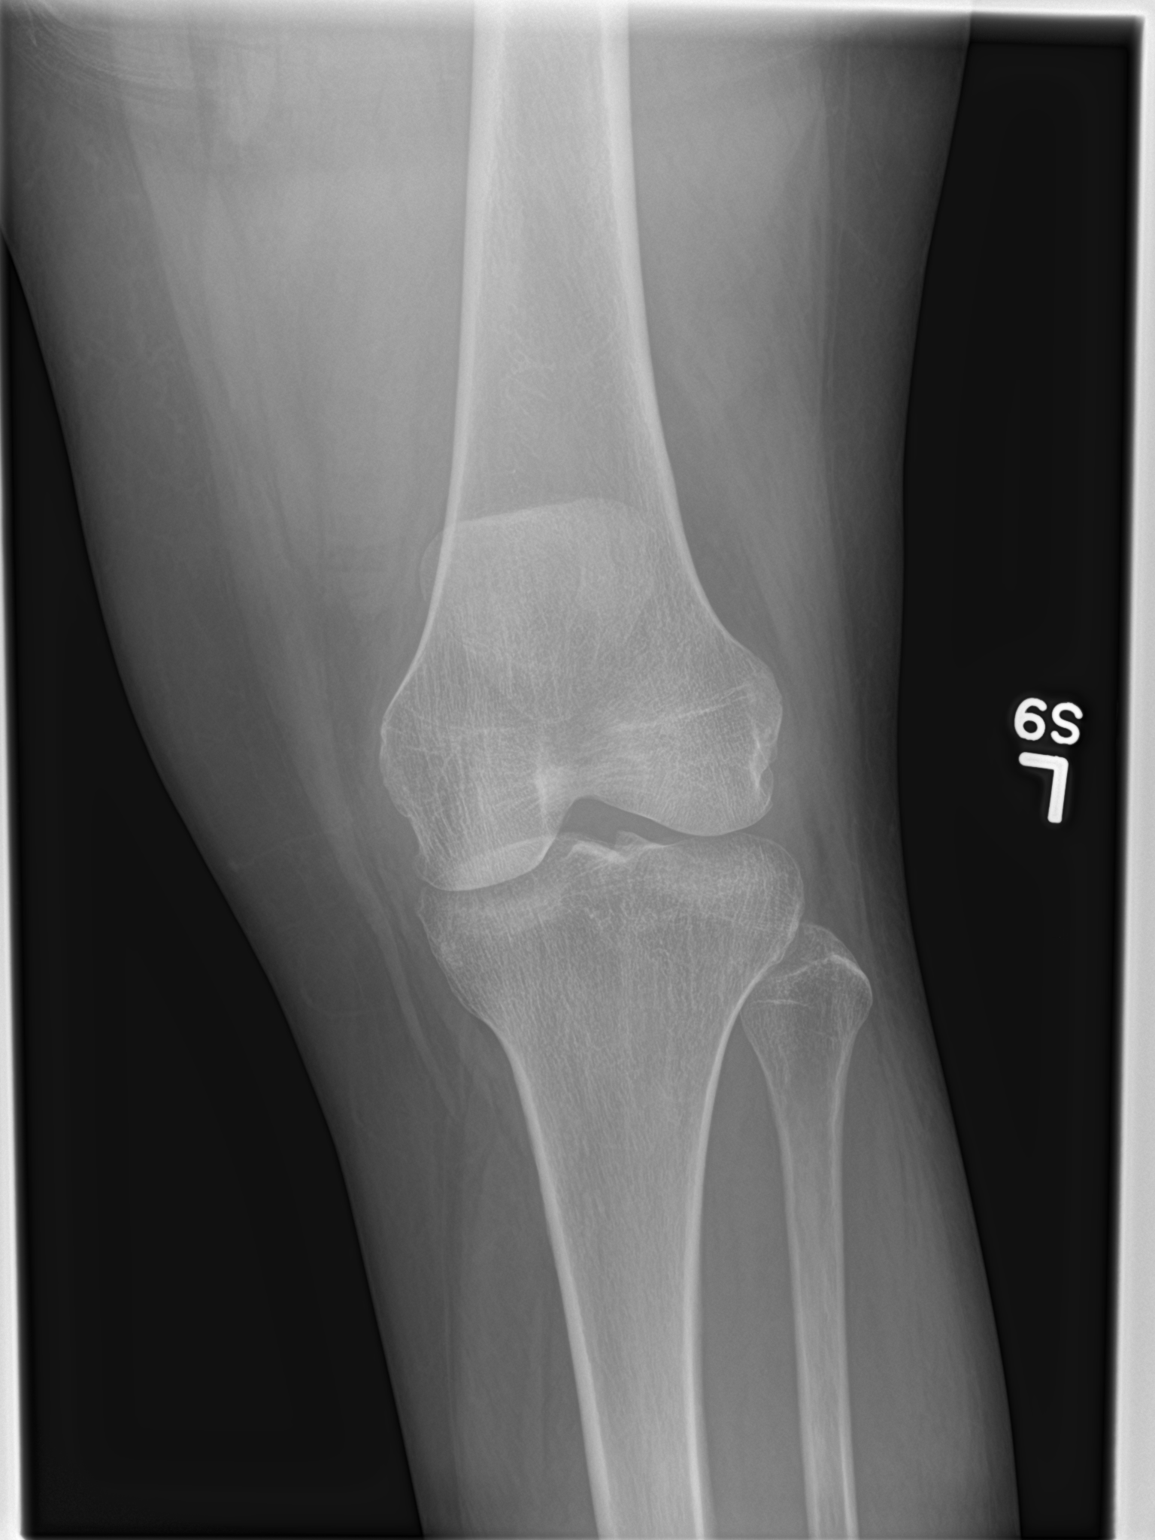

[knee lat]
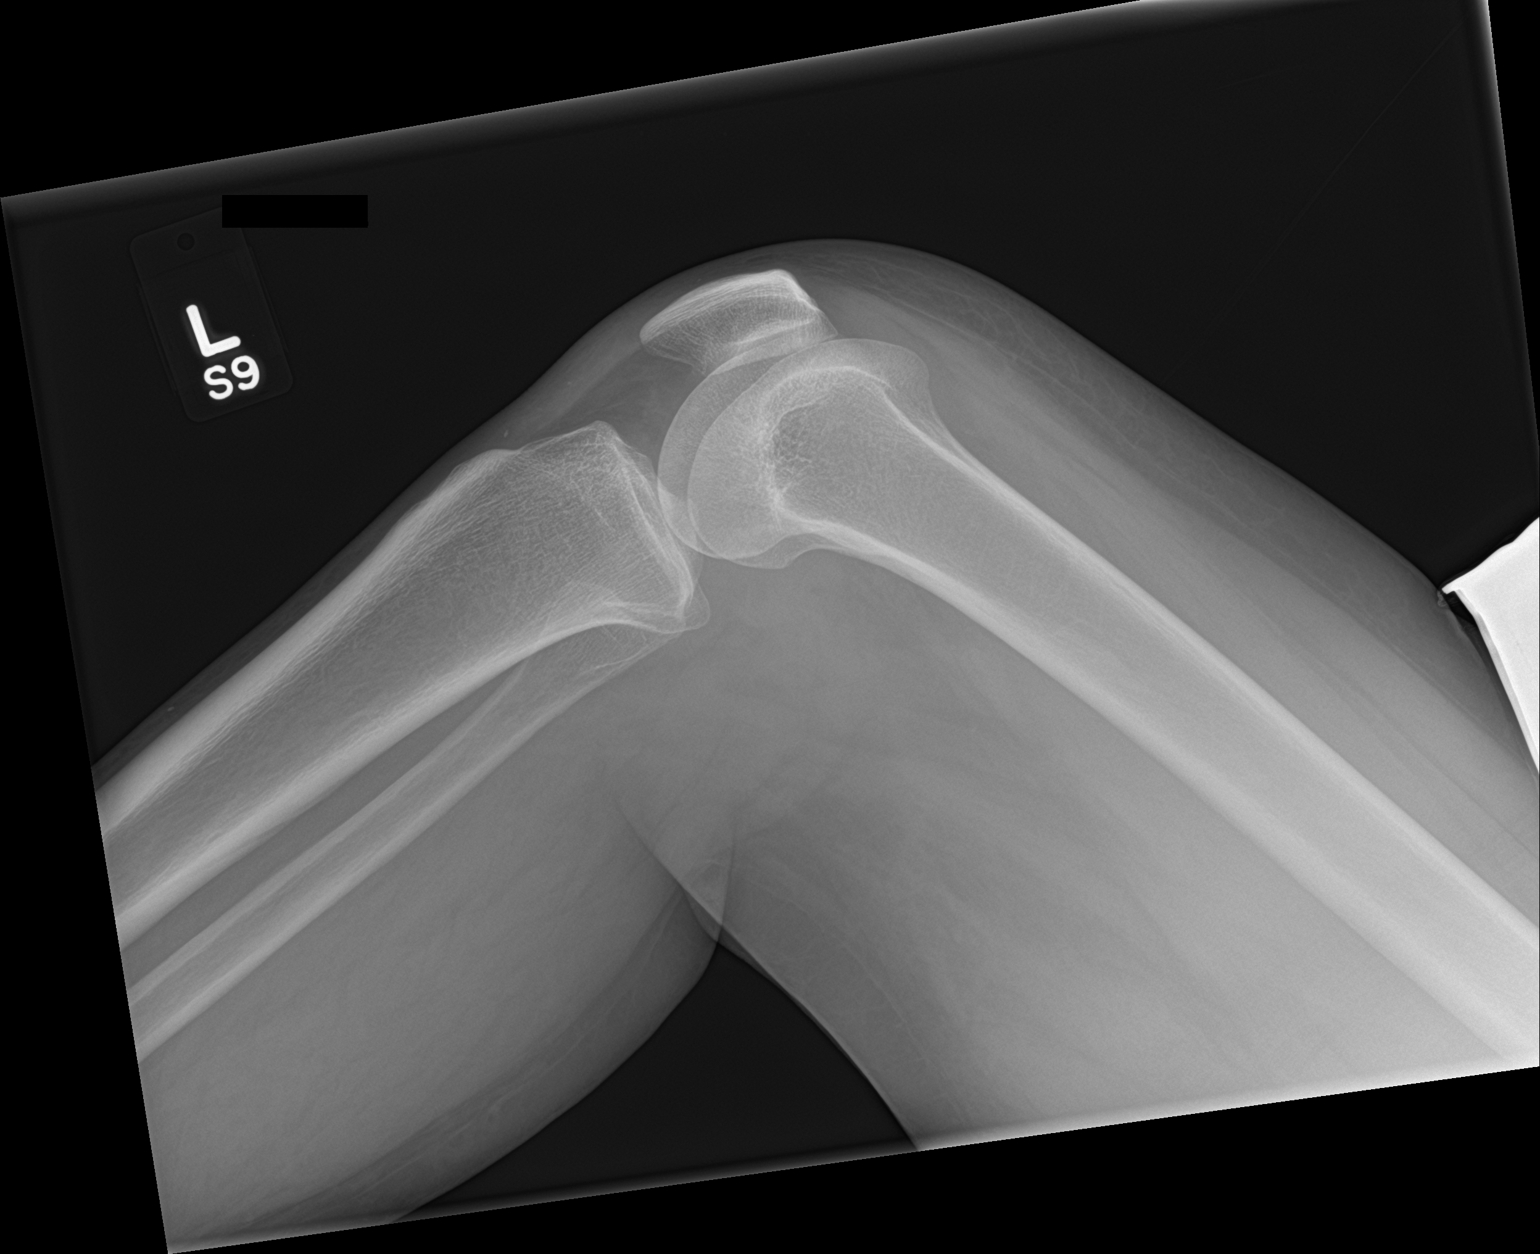

[2 of 2 positions shown; findings below may reference images not displayed]

FINDINGS: No evidence of fracture, dislocation, or joint effusion. No evidence
of arthropathy or other focal bone abnormality. Soft tissues are
unremarkable.
IMPRESSION: Negative.

## 2023-09-23 ENCOUNTER — Encounter: Payer: Self-pay | Admitting: Nurse Practitioner

## 2023-09-23 ENCOUNTER — Other Ambulatory Visit: Payer: Self-pay

## 2023-09-23 ENCOUNTER — Ambulatory Visit (INDEPENDENT_AMBULATORY_CARE_PROVIDER_SITE_OTHER): Payer: Self-pay | Admitting: Nurse Practitioner

## 2023-09-23 VITALS — BP 161/84 | HR 57 | Temp 98.7°F | Resp 12 | Ht 63.0 in | Wt 142.0 lb

## 2023-09-23 DIAGNOSIS — M79644 Pain in right finger(s): Secondary | ICD-10-CM | POA: Diagnosis not present

## 2023-09-23 DIAGNOSIS — Z1321 Encounter for screening for nutritional disorder: Secondary | ICD-10-CM

## 2023-09-23 DIAGNOSIS — G8929 Other chronic pain: Secondary | ICD-10-CM

## 2023-09-23 DIAGNOSIS — Z1231 Encounter for screening mammogram for malignant neoplasm of breast: Secondary | ICD-10-CM | POA: Diagnosis not present

## 2023-09-23 DIAGNOSIS — I10 Essential (primary) hypertension: Secondary | ICD-10-CM

## 2023-09-23 DIAGNOSIS — M79645 Pain in left finger(s): Secondary | ICD-10-CM

## 2023-09-23 DIAGNOSIS — Z130 Encounter for screening for diseases of the blood and blood-forming organs and certain disorders involving the immune mechanism: Secondary | ICD-10-CM

## 2023-09-23 DIAGNOSIS — Z13228 Encounter for screening for other metabolic disorders: Secondary | ICD-10-CM

## 2023-09-23 DIAGNOSIS — Z1211 Encounter for screening for malignant neoplasm of colon: Secondary | ICD-10-CM | POA: Diagnosis not present

## 2023-09-23 DIAGNOSIS — Z1329 Encounter for screening for other suspected endocrine disorder: Secondary | ICD-10-CM

## 2023-09-23 HISTORY — DX: Essential (primary) hypertension: I10

## 2023-09-23 HISTORY — DX: Other chronic pain: G89.29

## 2023-09-23 MED ORDER — IBUPROFEN 600 MG PO TABS
600.0000 mg | ORAL_TABLET | Freq: Three times a day (TID) | ORAL | 0 refills | Status: DC | PRN
  Filled 2023-09-23: qty 30, 10d supply, fill #0

## 2023-09-23 MED ORDER — AMLODIPINE BESYLATE 5 MG PO TABS
5.0000 mg | ORAL_TABLET | Freq: Every day | ORAL | 0 refills | Status: DC
  Filled 2023-09-23: qty 90, 90d supply, fill #0

## 2023-09-23 NOTE — Assessment & Plan Note (Signed)
Start ibuprofen 600 mg every 8 hours as needed, encouraged to take this medication with food to prevent GI upset.  Patient encouraged to alternate ibuprofen with OTC Tylenol

## 2023-09-23 NOTE — Patient Instructions (Addendum)
Please consider getting your flu vaccine  and shingles vaccine.   Around 3 times per week, check your blood pressure 2 times per day. once in the morning and once in the evening. The readings should be at least one minute apart. Write down these values and bring them to your next nurse visit/appointment.  When you check your BP, make sure you have been doing something calm/relaxing 5 minutes prior to checking. Both feet should be flat on the floor and you should be sitting. Use your left arm and make sure it is in a relaxed position (on a table), and that the cuff is at the approximate level/height of your heart.     It is important that you exercise regularly at least 30 minutes 5 times a week as tolerated  Think about what you will eat, plan ahead. Choose " clean, green, fresh or frozen" over canned, processed or packaged foods which are more sugary, salty and fatty. 70 to 75% of food eaten should be vegetables and fruit. Three meals at set times with snacks allowed between meals, but they must be fruit or vegetables. Aim to eat over a 12 hour period , example 7 am to 7 pm, and STOP after  your last meal of the day. Drink water,generally about 64 ounces per day, no other drink is as healthy. Fruit juice is best enjoyed in a healthy way, by EATING the fruit.  Thanks for choosing [facility] we consider it a privelige to serve you.

## 2023-09-23 NOTE — Progress Notes (Signed)
Pt is here to est care  Complaining of bi-lat hand pain on and off for X6 months No injury to same per pt

## 2023-09-23 NOTE — Progress Notes (Signed)
New Patient Office Visit  Subjective:  Patient ID: Madison Hunt, female    DOB: [birth year]  Age: 52 y.o. MRN: [id number]  CC: No chief complaint on file.   HPI Madison Hunt is a 52 y.o. female with no significant past medical history who presents for establishing care.  Patient complains of pain in both her thumbs that started 3 months ago.  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  States that her pain is usually worse at work and she has not noticed some swelling with the pain.  She denies fever, numbness, tingling, redness.  She has been taking OTC Tylenol and ibuprofen as needed.   Never had a mammogram mammogram ordered today, referral sent for colon cancer screening  Patient declined flu vaccine in the office today.  She was encouraged to consider getting the vaccine and also get a shingles vaccine as well.  Today that she has had a Tdap vaccine in the past 10 years  Need for cervical cancer screening discussed  Patient is accompanied by a medical interpreter who assisted with interpretation.       History reviewed. No pertinent past medical history.  History reviewed. No pertinent surgical history.  History reviewed. No pertinent family history.  Social History   Socioeconomic History   Marital status: Married    Spouse name: Not on file   Number of children: 8   Years of education: Not on file   Highest education level: Not on file  Occupational History   Not on file  Tobacco Use   Smoking status: Never   Smokeless tobacco: Never  Substance and Sexual Activity   Alcohol use: No   Drug use: No   Sexual activity: Yes  Other Topics Concern   Not on file  Social History Narrative   Lives with her spouse    Social Determinants of Health   Financial Resource Strain: Not on file  Food Insecurity: Not on file  Transportation Needs: Not on file  Physical Activity: Not on file  Stress: Not on file  Social Connections: Not on file   Intimate Partner Violence: Not on file    ROS Review of Systems  Constitutional: Negative.  Negative for activity change, appetite change, chills and diaphoresis.  HENT: Negative.    Respiratory: Negative.  Negative for apnea, cough, choking and chest tightness.   Cardiovascular: Negative.  Negative for chest pain, palpitations and leg swelling.  Gastrointestinal: Negative.  Negative for abdominal distention, abdominal pain and anal bleeding.  Genitourinary:  Positive for enuresis. Negative for difficulty urinating, dyspareunia and dysuria.  Musculoskeletal:  Positive for arthralgias and joint swelling. Negative for back pain and gait problem.  Skin: Negative.  Negative for color change, pallor, rash and wound.  Allergic/Immunologic: Negative.   Neurological: Negative.  Negative for dizziness, facial asymmetry, light-headedness and headaches.  Psychiatric/Behavioral: Negative.      Objective:   Today's Vitals: BP (!) 161/84   Pulse (!) 57   Temp 98.7 F (37.1 C)   Resp 12   Ht 5\' 3"  (1.6 m)   Wt 142 lb (64.4 kg)   LMP  (LMP Unknown)   SpO2 99%   BMI 25.15 kg/m   Physical Exam Vitals and nursing note reviewed.  Constitutional:      General: She is not in acute distress.    Appearance: Normal appearance. She is not ill-appearing, toxic-appearing or diaphoretic.  HENT:     Mouth/Throat:  Mouth: Mucous membranes are moist.     Pharynx: Oropharynx is clear. No oropharyngeal exudate or posterior oropharyngeal erythema.  Eyes:     General: No scleral icterus.       Right eye: No discharge.        Left eye: No discharge.     Extraocular Movements: Extraocular movements intact.     Conjunctiva/sclera: Conjunctivae normal.  Cardiovascular:     Rate and Rhythm: Normal rate and regular rhythm.     Pulses: Normal pulses.     Heart sounds: Normal heart sounds. No murmur heard.    No friction rub. No gallop.  Pulmonary:     Effort: Pulmonary effort is normal. No respiratory  distress.     Breath sounds: Normal breath sounds. No stridor. No wheezing, rhonchi or rales.  Chest:     Chest wall: No tenderness.  Abdominal:     General: There is no distension.     Palpations: Abdomen is soft.     Tenderness: There is no abdominal tenderness. There is no right CVA tenderness, left CVA tenderness or guarding.  Musculoskeletal:        General: Tenderness present. No swelling, deformity or signs of injury.     Right lower leg: No edema.     Left lower leg: No edema.     Comments: Tenderness on range of motion of bilateral thumb.  Skin warm and dry no swelling or redness noted.  Has palpable radial pulses  Skin:    General: Skin is warm and dry.     Capillary Refill: Capillary refill takes less than 2 seconds.     Coloration: Skin is not jaundiced or pale.     Findings: No bruising, erythema or lesion.  Neurological:     Mental Status: She is alert and oriented to person, place, and time.     Motor: No weakness.     Coordination: Coordination normal.     Gait: Gait normal.  Psychiatric:        Mood and Affect: Mood normal.        Behavior: Behavior normal.        Thought Content: Thought content normal.        Judgment: Judgment normal.     Assessment & Plan:   Problem List Items Addressed This Visit       Cardiovascular and Mediastinum   Hypertension - Primary    BP Readings from Last 3 Encounters:  09/23/23 (!) 161/84  02/16/17 148/97  11/23/16 153/69  Blood pressure is significantly elevated Patient denies dizziness, chest pain, edema Will start amlodipine 5 mg daily Encouraged to monitor blood pressure at home and keep a log Importance of adequate rest discussed Discussed DASH diet and dietary sodium restrictions Continue to increase dietary efforts and exercise.  Follow-up in 4 weeks Checking CMP today      Relevant Medications   amLODipine (NORVASC) 5 MG tablet     Other   Chronic thumb pain, bilateral    Start ibuprofen 600 mg every 8  hours as needed, encouraged to take this medication with food to prevent GI upset.  Patient encouraged to alternate ibuprofen with OTC Tylenol       Relevant Medications   ibuprofen (ADVIL) 600 MG tablet   Other Visit Diagnoses     Screening mammogram for breast cancer       Relevant Orders   MM Digital Screening   Screening for colon cancer  Relevant Orders   Ambulatory referral to Gastroenterology   Screening for endocrine, nutritional, metabolic and immunity disorder       Relevant Orders   CBC   CMP14+EGFR   Hemoglobin A1c       Outpatient Encounter Medications as of 09/23/2023  Medication Sig   amLODipine (NORVASC) 5 MG tablet Take 1 tablet (5 mg total) by mouth daily.   ibuprofen (ADVIL) 600 MG tablet Take 1 tablet (600 mg total) by mouth every 8 (eight) hours as needed.   [DISCONTINUED] ibuprofen (ADVIL,MOTRIN) 800 MG tablet Take 1 tablet (800 mg total) by mouth every 8 (eight) hours as needed.   [DISCONTINUED] fluticasone (FLONASE) 50 MCG/ACT nasal spray Place 2 sprays into both nostrils daily. (Patient not taking: Reported on 02/16/2017)   [DISCONTINUED] guaiFENesin (ROBITUSSIN) 100 MG/5ML liquid Take 5-10 mLs (100-200 mg total) by mouth every 4 (four) hours as needed for cough. (Patient not taking: Reported on 02/16/2017)   [DISCONTINUED] naproxen (NAPROSYN) 500 MG tablet Take 1 tablet (500 mg total) by mouth 2 (two) times daily with a meal. For pain (Patient not taking: Reported on 02/16/2017)   [DISCONTINUED] OVER THE COUNTER MEDICATION Take 1 tablet by mouth every 6 (six) hours as needed (pain/headache). (Patient not taking: Reported on 09/23/2023)   No facility-administered encounter medications on file as of 09/23/2023.    Follow-up: Return in about 4 weeks (around 10/21/2023) for HTN.   Donell Beers, FNP

## 2023-09-23 NOTE — Assessment & Plan Note (Signed)
BP Readings from Last 3 Encounters:  09/23/23 (!) 161/84  02/16/17 148/97  11/23/16 153/69  Blood pressure is significantly elevated Patient denies dizziness, chest pain, edema Will start amlodipine 5 mg daily Encouraged to monitor blood pressure at home and keep a log Importance of adequate rest discussed Discussed DASH diet and dietary sodium restrictions Continue to increase dietary efforts and exercise.  Follow-up in 4 weeks Checking CMP today

## 2023-09-24 LAB — CMP14+EGFR
ALT: 10 IU/L (ref 0–32)
AST: 18 IU/L (ref 0–40)
Albumin: 4.2 g/dL (ref 3.8–4.9)
Alkaline Phosphatase: 94 IU/L (ref 44–121)
BUN/Creatinine Ratio: 16 (ref 9–23)
BUN: 12 mg/dL (ref 6–24)
Bilirubin Total: <0.2 mg/dL (ref 0.0–1.2)
CO2: 21 mmol/L (ref 20–29)
Calcium: 9.2 mg/dL (ref 8.7–10.2)
Chloride: 106 mmol/L (ref 96–106)
Creatinine, Ser: 0.73 mg/dL (ref 0.57–1.00)
Globulin, Total: 3 g/dL (ref 1.5–4.5)
Glucose: 99 mg/dL (ref 70–99)
Potassium: 3.5 mmol/L (ref 3.5–5.2)
Sodium: 141 mmol/L (ref 134–144)
Total Protein: 7.2 g/dL (ref 6.0–8.5)
eGFR: 98 mL/min/{1.73_m2} (ref >59)

## 2023-09-24 LAB — CBC
Hematocrit: 41 % (ref 34.0–46.6)
Hemoglobin: 13.5 g/dL (ref 11.1–15.9)
MCH: 31 pg (ref 26.6–33.0)
MCHC: 32.9 g/dL (ref 31.5–35.7)
MCV: 94 fL (ref 79–97)
Platelets: 219 10*3/uL (ref 150–450)
RBC: 4.36 x10E6/uL (ref 3.77–5.28)
RDW: 13.5 % (ref 11.7–15.4)
WBC: 6.9 10*3/uL (ref 3.4–10.8)

## 2023-09-24 LAB — HEMOGLOBIN A1C
Est. average glucose Bld gHb Est-mCnc: 114 mg/dL
Hgb A1c MFr Bld: 5.6 % (ref 4.8–5.6)

## 2023-10-21 ENCOUNTER — Ambulatory Visit: Payer: Self-pay | Admitting: Nurse Practitioner

## 2023-10-22 ENCOUNTER — Ambulatory Visit (INDEPENDENT_AMBULATORY_CARE_PROVIDER_SITE_OTHER): Payer: Self-pay | Admitting: Nurse Practitioner

## 2023-10-22 ENCOUNTER — Other Ambulatory Visit: Payer: Self-pay

## 2023-10-22 ENCOUNTER — Encounter: Payer: Self-pay | Admitting: Nurse Practitioner

## 2023-10-22 VITALS — BP 171/83 | HR 53 | Temp 96.6°F | Wt 140.0 lb

## 2023-10-22 DIAGNOSIS — M79644 Pain in right finger(s): Secondary | ICD-10-CM

## 2023-10-22 DIAGNOSIS — Z1329 Encounter for screening for other suspected endocrine disorder: Secondary | ICD-10-CM

## 2023-10-22 DIAGNOSIS — Z1211 Encounter for screening for malignant neoplasm of colon: Secondary | ICD-10-CM | POA: Diagnosis not present

## 2023-10-22 DIAGNOSIS — G8929 Other chronic pain: Secondary | ICD-10-CM

## 2023-10-22 DIAGNOSIS — I10 Essential (primary) hypertension: Secondary | ICD-10-CM

## 2023-10-22 DIAGNOSIS — Z130 Encounter for screening for diseases of the blood and blood-forming organs and certain disorders involving the immune mechanism: Secondary | ICD-10-CM

## 2023-10-22 DIAGNOSIS — M79645 Pain in left finger(s): Secondary | ICD-10-CM

## 2023-10-22 DIAGNOSIS — Z1321 Encounter for screening for nutritional disorder: Secondary | ICD-10-CM

## 2023-10-22 DIAGNOSIS — Z13228 Encounter for screening for other metabolic disorders: Secondary | ICD-10-CM

## 2023-10-22 MED ORDER — IBUPROFEN 600 MG PO TABS
600.0000 mg | ORAL_TABLET | Freq: Three times a day (TID) | ORAL | 1 refills | Status: AC | PRN
  Filled 2023-10-22: qty 30, 10d supply, fill #0

## 2023-10-22 NOTE — Patient Instructions (Addendum)
Please call [phone] to schedule your mammogram  Please consider getting your influenza vaccine and shingles vaccine  I encourage you to take your blood pressure medication daily as ordered to help get your blood pressure under control.  Please make sure to take your blood pressure medication always when coming for your appointments even when we tell you to fast  Please take ibuprofen with food to help prevent stomach upset    1. Screening for colon cancer  - Cologuard  2. Chronic thumb pain, bilateral  - ibuprofen (ADVIL) 600 MG tablet; Take 1 tablet (600 mg total) by mouth every 8 (eight) hours as needed.  Dispense: 30 tablet; Refill: 1  3. Screening for endocrine, nutritional, metabolic and immunity disorder  - HIV Antibody (routine testing w rflx) - Lipid panel - Hepatitis C antibody    It is important that you exercise regularly at least 30 minutes 5 times a week as tolerated  Think about what you will eat, plan ahead. Choose " clean, green, fresh or frozen" over canned, processed or packaged foods which are more sugary, salty and fatty. 70 to 75% of food eaten should be vegetables and fruit. Three meals at set times with snacks allowed between meals, but they must be fruit or vegetables. Aim to eat over a 12 hour period , example 7 am to 7 pm, and STOP after  your last meal of the day. Drink water,generally about 64 ounces per day, no other drink is as healthy. Fruit juice is best enjoyed in a healthy way, by EATING the fruit.  Thanks for choosing [facility] we consider it a privelige to serve you.

## 2023-10-22 NOTE — Assessment & Plan Note (Signed)
BP Readings from Last 3 Encounters:  10/22/23 (!) 171/83  09/23/23 (!) 161/84  02/16/17 148/97   HTN unControlled .  Patient encouraged to restart amlodipine 5 mg daily Need to get blood pressure under control discussed Continue current medications. No changes in management. Discussed DASH diet and dietary sodium restrictions Continue to increase dietary efforts and exercise.  Follow-up in 4 weeks

## 2023-10-22 NOTE — Assessment & Plan Note (Signed)
Ibuprofen 600 mg daily as needed helping Medication refilled today

## 2023-10-22 NOTE — Progress Notes (Signed)
Established Patient Office Visit  Subjective:  Patient ID: Madison Hunt, female    DOB: 14-Oct-1971  Age: 52 y.o. MRN: [id number]  CC:  Chief Complaint  Patient presents with   Hypertension    Follow up did not take b/p med today. Last dose was Sunday    HPI Madison Hunt is a 52 y.o. female with past medical history of hypertension, chronic bilateral thumb pain who presents for f/u of her chronic medical conditions.    Hypertension.  Patient was started on amlodipine 5 mg daily at her last appointment.  Stated that she stopped taking the medication 2 days ago because she was afraid of taking the medication.  Patient denies chest pain, shortness of breath, edema.  States that her chronic thumb pain is much better on ibuprofen, takes ibuprofen 600 mg daily as needed.  Patient declined flu vaccine in the office today. Cologuard ordered to screen for colon cancer Provided phone number to schedule her mammogram, need for cervical cancer screening discussed she plans to have this done at her next appointment.  Interpretation services provided via [place] iPad    Past Medical History:  Diagnosis Date   Chronic thumb pain, bilateral 09/23/2023   Hypertension 09/23/2023    History reviewed. No pertinent surgical history.  No family history on file.  Social History   Socioeconomic History   Marital status: Married    Spouse name: Not on file   Number of children: 8   Years of education: Not on file   Highest education level: Not on file  Occupational History   Not on file  Tobacco Use   Smoking status: Never   Smokeless tobacco: Never  Substance and Sexual Activity   Alcohol use: No   Drug use: No   Sexual activity: Yes  Other Topics Concern   Not on file  Social History Narrative   Lives with her spouse    Social Determinants of Health   Financial Resource Strain: Not on file  Food Insecurity: Not on file  Transportation Needs: Not on file   Physical Activity: Not on file  Stress: Not on file  Social Connections: Not on file  Intimate Partner Violence: Not on file    Outpatient Medications Prior to Visit  Medication Sig Dispense Refill   amLODipine (NORVASC) 5 MG tablet Take 1 tablet (5 mg total) by mouth daily. 90 tablet 0   ibuprofen (ADVIL) 600 MG tablet Take 1 tablet (600 mg total) by mouth every 8 (eight) hours as needed. 30 tablet 0   No facility-administered medications prior to visit.    No Known Allergies  ROS Review of Systems  Constitutional:  Negative for appetite change, chills, fatigue and fever.  HENT:  Negative for congestion, postnasal drip, rhinorrhea and sneezing.   Respiratory:  Negative for cough, shortness of breath and wheezing.   Cardiovascular:  Negative for chest pain, palpitations and leg swelling.  Gastrointestinal:  Negative for abdominal pain, constipation, nausea and vomiting.  Genitourinary:  Negative for difficulty urinating, dysuria, flank pain and frequency.  Musculoskeletal:  Positive for arthralgias. Negative for back pain, joint swelling and myalgias.  Skin:  Negative for color change, pallor, rash and wound.  Neurological:  Negative for dizziness, facial asymmetry, weakness, numbness and headaches.  Psychiatric/Behavioral:  Negative for behavioral problems, confusion, self-injury and suicidal ideas.       Objective:    Physical Exam Vitals and nursing note reviewed.  Constitutional:  General: She is not in acute distress.    Appearance: Normal appearance. She is not ill-appearing, toxic-appearing or diaphoretic.  HENT:     Mouth/Throat:     Mouth: Mucous membranes are moist.     Pharynx: Oropharynx is clear. No oropharyngeal exudate or posterior oropharyngeal erythema.  Eyes:     General: No scleral icterus.       Right eye: No discharge.        Left eye: No discharge.     Extraocular Movements: Extraocular movements intact.     Conjunctiva/sclera: Conjunctivae  normal.  Cardiovascular:     Rate and Rhythm: Normal rate and regular rhythm.     Pulses: Normal pulses.     Heart sounds: Normal heart sounds. No murmur heard.    No friction rub. No gallop.  Pulmonary:     Effort: Pulmonary effort is normal. No respiratory distress.     Breath sounds: Normal breath sounds. No stridor. No wheezing, rhonchi or rales.  Chest:     Chest wall: No tenderness.  Abdominal:     General: There is no distension.     Palpations: Abdomen is soft.     Tenderness: There is no abdominal tenderness. There is no right CVA tenderness, left CVA tenderness or guarding.  Musculoskeletal:        General: Tenderness present. No swelling, deformity or signs of injury.     Right lower leg: No edema.     Left lower leg: No edema.     Comments: Patient reported tenderness on range of motion of bilateral thumb.  Skin warm and dry.  No redness or swelling noted  Skin:    General: Skin is warm and dry.     Capillary Refill: Capillary refill takes less than 2 seconds.     Coloration: Skin is not jaundiced or pale.     Findings: No bruising, erythema or lesion.  Neurological:     Mental Status: She is alert and oriented to person, place, and time.     Motor: No weakness.     Coordination: Coordination normal.     Gait: Gait normal.  Psychiatric:        Mood and Affect: Mood normal.        Behavior: Behavior normal.        Thought Content: Thought content normal.        Judgment: Judgment normal.     BP (!) 171/83   Pulse (!) 53   Temp (!) 96.6 F (35.9 C)   Wt 140 lb (63.5 kg)   LMP  (LMP Unknown)   SpO2 100%   BMI 24.80 kg/m  Wt Readings from Last 3 Encounters:  10/22/23 140 lb (63.5 kg)  09/23/23 142 lb (64.4 kg)  01/26/16 124 lb 6.4 oz (56.4 kg)    No results found for: "TSH" Lab Results  Component Value Date   WBC 6.9 09/23/2023   HGB 13.5 09/23/2023   HCT 41.0 09/23/2023   MCV 94 09/23/2023   PLT 219 09/23/2023   Lab Results  Component Value Date    NA 141 09/23/2023   K 3.5 09/23/2023   CO2 21 09/23/2023   GLUCOSE 99 09/23/2023   BUN 12 09/23/2023   CREATININE 0.73 09/23/2023   BILITOT <0.2 09/23/2023   ALKPHOS 94 09/23/2023   AST 18 09/23/2023   ALT 10 09/23/2023   PROT 7.2 09/23/2023   ALBUMIN 4.2 09/23/2023   CALCIUM 9.2 09/23/2023   ANIONGAP 6 02/16/2017  EGFR 98 09/23/2023   No results found for: "CHOL" No results found for: "HDL" No results found for: "LDLCALC" No results found for: "TRIG" No results found for: "CHOLHDL" Lab Results  Component Value Date   HGBA1C 5.6 09/23/2023      Assessment & Plan:   Problem List Items Addressed This Visit       Cardiovascular and Mediastinum   Hypertension    BP Readings from Last 3 Encounters:  10/22/23 (!) 171/83  09/23/23 (!) 161/84  02/16/17 148/97   HTN unControlled .  Patient encouraged to restart amlodipine 5 mg daily Need to get blood pressure under control discussed Continue current medications. No changes in management. Discussed DASH diet and dietary sodium restrictions Continue to increase dietary efforts and exercise.  Follow-up in 4 weeks          Other   Chronic thumb pain, bilateral    Ibuprofen 600 mg daily as needed helping Medication refilled today      Relevant Medications   ibuprofen (ADVIL) 600 MG tablet   Other Visit Diagnoses     Screening for colon cancer    -  Primary   Relevant Orders   Cologuard   Screening for endocrine, nutritional, metabolic and immunity disorder       Relevant Orders   HIV Antibody (routine testing w rflx)   Lipid panel   Hepatitis C antibody       Meds ordered this encounter  Medications   ibuprofen (ADVIL) 600 MG tablet    Sig: Take 1 tablet (600 mg total) by mouth every 8 (eight) hours as needed.    Dispense:  30 tablet    Refill:  1    Follow-up: Return in about 4 weeks (around 11/19/2023) for HTN.    Donell Beers, FNP

## 2023-10-23 LAB — LIPID PANEL
Chol/HDL Ratio: 3.3 ratio (ref 0.0–4.4)
Cholesterol, Total: 152 mg/dL (ref 100–199)
HDL: 46 mg/dL (ref >39)
LDL Chol Calc (NIH): 89 mg/dL (ref 0–99)
Triglycerides: 88 mg/dL (ref 0–149)
VLDL Cholesterol Cal: 17 mg/dL (ref 5–40)

## 2023-10-23 LAB — HEPATITIS C ANTIBODY: Hep C Virus Ab: NONREACTIVE

## 2023-10-23 LAB — HIV ANTIBODY (ROUTINE TESTING W REFLEX): HIV Screen 4th Generation wRfx: NONREACTIVE

## 2023-11-03 ENCOUNTER — Other Ambulatory Visit: Payer: Self-pay

## 2023-11-19 ENCOUNTER — Encounter: Payer: Self-pay | Admitting: Nurse Practitioner

## 2023-11-19 ENCOUNTER — Other Ambulatory Visit: Payer: Self-pay

## 2023-11-19 ENCOUNTER — Other Ambulatory Visit (HOSPITAL_COMMUNITY)
Admission: RE | Admit: 2023-11-19 | Discharge: 2023-11-19 | Disposition: A | Discharge status: Home / Self Care | Payer: Medicaid Other | Source: Ambulatory Visit | Attending: Nurse Practitioner | Admitting: Nurse Practitioner

## 2023-11-19 ENCOUNTER — Ambulatory Visit (INDEPENDENT_AMBULATORY_CARE_PROVIDER_SITE_OTHER): Payer: Self-pay | Admitting: Nurse Practitioner

## 2023-11-19 VITALS — BP 131/74 | HR 55 | Temp 96.7°F | Wt 140.8 lb

## 2023-11-19 DIAGNOSIS — Z1151 Encounter for screening for human papillomavirus (HPV): Secondary | ICD-10-CM | POA: Insufficient documentation

## 2023-11-19 DIAGNOSIS — I10 Essential (primary) hypertension: Secondary | ICD-10-CM

## 2023-11-19 DIAGNOSIS — Z124 Encounter for screening for malignant neoplasm of cervix: Secondary | ICD-10-CM | POA: Insufficient documentation

## 2023-11-19 DIAGNOSIS — Z01419 Encounter for gynecological examination (general) (routine) without abnormal findings: Secondary | ICD-10-CM | POA: Diagnosis not present

## 2023-11-19 MED ORDER — AMLODIPINE BESYLATE 5 MG PO TABS
5.0000 mg | ORAL_TABLET | Freq: Every day | ORAL | 1 refills | Status: AC
  Filled 2023-11-19: qty 90, 90d supply, fill #0

## 2023-11-19 NOTE — Assessment & Plan Note (Addendum)
BP Readings from Last 3 Encounters:  11/19/23 131/74  10/22/23 (!) 171/83  09/23/23 (!) 161/84   HTN Controlled .  On amlodipine 5 mg daily Continue current medications. No changes in management. Discussed DASH diet and dietary sodium restrictions Continue to increase dietary efforts,need for moderate exercise at least 150 minutes weekly as tolerated discussed.  Follow-up in 4 months

## 2023-11-19 NOTE — Progress Notes (Addendum)
Established Patient Office Visit  Subjective:  Patient ID: Madison Hunt, female    DOB: [birth year]  Age: 52 y.o. MRN: [id number]  CC:  Chief Complaint  Patient presents with   Hypertension    HPI Madison Hunt is a 52 y.o. female  has a past medical history of Chronic thumb pain, bilateral (09/23/2023) and Hypertension (09/23/2023).  Patient presents for follow-up for hypertension  Hypertension.  Currently on amlodipine 5 mg daily, patient stated that she has been taking the medication daily as ordered.  She denies chest pain, shortness of breath, edema  Due for Pap smear Pap smear completed in the office today, patient encouraged to get her mammogram done as ordered.  She has received the Cologuard testing kit but has not completed the test, today the patient was shown how to complete the test.  Interpretation services provided by a medical interpreter Past Medical History:  Diagnosis Date   Chronic thumb pain, bilateral 09/23/2023   Hypertension 09/23/2023    History reviewed. No pertinent surgical history.  History reviewed. No pertinent family history.  Social History   Socioeconomic History   Marital status: Married    Spouse name: Not on file   Number of children: 8   Years of education: Not on file   Highest education level: Not on file  Occupational History   Not on file  Tobacco Use   Smoking status: Never   Smokeless tobacco: Never  Substance and Sexual Activity   Alcohol use: No   Drug use: No   Sexual activity: Yes  Other Topics Concern   Not on file  Social History Narrative   Lives with her spouse    Social Determinants of Health   Financial Resource Strain: Not on file  Food Insecurity: Not on file  Transportation Needs: Not on file  Physical Activity: Not on file  Stress: Not on file  Social Connections: Not on file  Intimate Partner Violence: Not on file    Outpatient Medications Prior to Visit  Medication Sig Dispense  Refill   ibuprofen (ADVIL) 600 MG tablet Take 1 tablet (600 mg total) by mouth every 8 (eight) hours as needed. 30 tablet 1   amLODipine (NORVASC) 5 MG tablet Take 1 tablet (5 mg total) by mouth daily. 90 tablet 0   No facility-administered medications prior to visit.    No Known Allergies  ROS Review of Systems  Constitutional:  Negative for appetite change, chills, fatigue and fever.  HENT:  Negative for congestion, postnasal drip, rhinorrhea and sneezing.   Respiratory:  Negative for cough, shortness of breath and wheezing.   Cardiovascular:  Negative for chest pain, palpitations and leg swelling.  Gastrointestinal:  Negative for abdominal pain, constipation, nausea and vomiting.  Genitourinary:  Negative for difficulty urinating, dysuria, flank pain and frequency.  Musculoskeletal:  Negative for arthralgias, back pain, joint swelling and myalgias.  Skin:  Negative for color change, pallor, rash and wound.  Neurological:  Negative for dizziness, facial asymmetry, weakness, numbness and headaches.  Psychiatric/Behavioral:  Negative for behavioral problems, confusion, self-injury and suicidal ideas.       Objective:    Physical Exam Vitals and nursing note reviewed. Exam conducted with a chaperone present.  Constitutional:      General: She is not in acute distress.    Appearance: Normal appearance. She is not ill-appearing, toxic-appearing or diaphoretic.  HENT:     Mouth/Throat:     Mouth: Mucous membranes are moist.  Pharynx: Oropharynx is clear. No oropharyngeal exudate or posterior oropharyngeal erythema.  Eyes:     General: No scleral icterus.       Right eye: No discharge.        Left eye: No discharge.     Extraocular Movements: Extraocular movements intact.     Conjunctiva/sclera: Conjunctivae normal.  Cardiovascular:     Rate and Rhythm: Normal rate and regular rhythm.     Pulses: Normal pulses.     Heart sounds: Normal heart sounds. No murmur heard.    No  friction rub. No gallop.  Pulmonary:     Effort: Pulmonary effort is normal. No respiratory distress.     Breath sounds: Normal breath sounds. No stridor. No wheezing, rhonchi or rales.  Chest:     Chest wall: No mass, lacerations, deformity, swelling, tenderness or edema.  Breasts:    Tanner Score is 5.     Breasts are symmetrical.     Right: Normal. No swelling, bleeding, inverted nipple, mass, nipple discharge, skin change or tenderness.     Left: Normal. No swelling, bleeding, inverted nipple, mass, nipple discharge, skin change or tenderness.  Abdominal:     General: There is no distension.     Palpations: Abdomen is soft.     Tenderness: There is no abdominal tenderness. There is no right CVA tenderness, left CVA tenderness or guarding.     Hernia: There is no hernia in the left inguinal area or right inguinal area.  Genitourinary:    General: Normal vulva.     Exam position: Lithotomy position.     Pubic Area: No rash or pubic lice.      Tanner stage (genital): 5.     Labia:        Right: No rash, tenderness, lesion or injury.        Left: No rash, tenderness, lesion or injury.      Urethra: No prolapse, urethral pain, urethral swelling or urethral lesion.     Vagina: No signs of injury and foreign body. No vaginal discharge, erythema, tenderness, bleeding, lesions or prolapsed vaginal walls.     Cervix: No cervical motion tenderness, discharge, friability, lesion, erythema, cervical bleeding or eversion.     Uterus: Normal. Not enlarged, not fixed, not tender and no uterine prolapse.      Adnexa:        Right: No mass, tenderness or fullness.         Left: No mass, tenderness or fullness.    Musculoskeletal:        General: No swelling, tenderness, deformity or signs of injury.     Right lower leg: No edema.     Left lower leg: No edema.  Lymphadenopathy:     Upper Body:     Right upper body: No supraclavicular, axillary or pectoral adenopathy.     Left upper body: No  supraclavicular, axillary or pectoral adenopathy.     Lower Body: No right inguinal adenopathy. No left inguinal adenopathy.  Skin:    General: Skin is warm and dry.     Capillary Refill: Capillary refill takes less than 2 seconds.     Coloration: Skin is not jaundiced or pale.     Findings: No bruising, erythema or lesion.  Neurological:     Mental Status: She is alert and oriented to person, place, and time.     Motor: No weakness.     Coordination: Coordination normal.     Gait: Gait normal.  Psychiatric:        Mood and Affect: Mood normal.        Behavior: Behavior normal.        Thought Content: Thought content normal.        Judgment: Judgment normal.     BP 131/74   Pulse (!) 55   Temp (!) 96.7 F (35.9 C)   Wt 140 lb 12.8 oz (63.9 kg)   LMP  (LMP Unknown)   SpO2 98%   BMI 24.94 kg/m  Wt Readings from Last 3 Encounters:  11/19/23 140 lb 12.8 oz (63.9 kg)  10/22/23 140 lb (63.5 kg)  09/23/23 142 lb (64.4 kg)    No results found for: "TSH" Lab Results  Component Value Date   WBC 6.9 09/23/2023   HGB 13.5 09/23/2023   HCT 41.0 09/23/2023   MCV 94 09/23/2023   PLT 219 09/23/2023   Lab Results  Component Value Date   NA 141 09/23/2023   K 3.5 09/23/2023   CO2 21 09/23/2023   GLUCOSE 99 09/23/2023   BUN 12 09/23/2023   CREATININE 0.73 09/23/2023   BILITOT <0.2 09/23/2023   ALKPHOS 94 09/23/2023   AST 18 09/23/2023   ALT 10 09/23/2023   PROT 7.2 09/23/2023   ALBUMIN 4.2 09/23/2023   CALCIUM 9.2 09/23/2023   ANIONGAP 6 02/16/2017   EGFR 98 09/23/2023   Lab Results  Component Value Date   CHOL 152 10/22/2023   Lab Results  Component Value Date   HDL 46 10/22/2023   Lab Results  Component Value Date   LDLCALC 89 10/22/2023   Lab Results  Component Value Date   TRIG 88 10/22/2023   Lab Results  Component Value Date   CHOLHDL 3.3 10/22/2023   Lab Results  Component Value Date   HGBA1C 5.6 09/23/2023      Assessment & Plan:    Problem List Items Addressed This Visit       Cardiovascular and Mediastinum   Hypertension    BP Readings from Last 3 Encounters:  11/19/23 131/74  10/22/23 (!) 171/83  09/23/23 (!) 161/84   HTN Controlled .  On amlodipine 5 mg daily Continue current medications. No changes in management. Discussed DASH diet and dietary sodium restrictions Continue to increase dietary efforts,need for moderate exercise at least 150 minutes weekly as tolerated discussed.  Follow-up in 4 months        Relevant Medications   amLODipine (NORVASC) 5 MG tablet     Other   Screening for cervical cancer - Primary    Test completed without difficulty Sample sent to the lab for testing      Relevant Orders   Cytology - PAP([facility])    Meds ordered this encounter  Medications   amLODipine (NORVASC) 5 MG tablet    Sig: Take 1 tablet (5 mg total) by mouth daily.    Dispense:  90 tablet    Refill:  1    Follow-up: Return in about 4 months (around 03/18/2024) for HTN.    Donell Beers, FNP

## 2023-11-19 NOTE — Patient Instructions (Addendum)
Please call [phone] to schedule your mammogram to screen for breast cancer Please complete your Cologuard test to screen for colon cancer as discussed  Please get your  shingles vaccine at the pharmacy    It is important that you exercise regularly at least 30 minutes 5 times a week as tolerated  Think about what you will eat, plan ahead. Choose " clean, green, fresh or frozen" over canned, processed or packaged foods which are more sugary, salty and fatty. 70 to 75% of food eaten should be vegetables and fruit. Three meals at set times with snacks allowed between meals, but they must be fruit or vegetables. Aim to eat over a 12 hour period , example 7 am to 7 pm, and STOP after  your last meal of the day. Drink water,generally about 64 ounces per day, no other drink is as healthy. Fruit juice is best enjoyed in a healthy way, by EATING the fruit.  Thanks for choosing [facility] we consider it a privelige to serve you.

## 2023-11-19 NOTE — Assessment & Plan Note (Signed)
Test completed without difficulty Sample sent to the lab for testing

## 2023-11-25 LAB — CYTOLOGY - PAP
Comment: NEGATIVE
Diagnosis: NEGATIVE
Diagnosis: REACTIVE
High risk HPV: NEGATIVE

## 2024-03-19 ENCOUNTER — Ambulatory Visit: Payer: Self-pay | Admitting: Nurse Practitioner
